# Patient Record
Sex: Female | Born: 1965 | Race: White | Hispanic: No | Marital: Married | Smoking: Never smoker
Health system: Southern US, Community
[De-identification: ages and names within clinical notes are randomized; demographics above are authoritative.]

## PROBLEM LIST (undated history)

## (undated) DIAGNOSIS — R519 Headache, unspecified: Secondary | ICD-10-CM

## (undated) DIAGNOSIS — E039 Hypothyroidism, unspecified: Secondary | ICD-10-CM

## (undated) DIAGNOSIS — Z3183 Encounter for assisted reproductive fertility procedure cycle: Secondary | ICD-10-CM

## (undated) DIAGNOSIS — O1490 Unspecified pre-eclampsia, unspecified trimester: Secondary | ICD-10-CM

## (undated) DIAGNOSIS — E785 Hyperlipidemia, unspecified: Secondary | ICD-10-CM

## (undated) DIAGNOSIS — R51 Headache: Secondary | ICD-10-CM

## (undated) DIAGNOSIS — O139 Gestational [pregnancy-induced] hypertension without significant proteinuria, unspecified trimester: Secondary | ICD-10-CM

## (undated) DIAGNOSIS — F410 Panic disorder [episodic paroxysmal anxiety] without agoraphobia: Secondary | ICD-10-CM

## (undated) DIAGNOSIS — T7840XA Allergy, unspecified, initial encounter: Secondary | ICD-10-CM

## (undated) DIAGNOSIS — C4491 Basal cell carcinoma of skin, unspecified: Secondary | ICD-10-CM

## (undated) DIAGNOSIS — N200 Calculus of kidney: Secondary | ICD-10-CM

## (undated) HISTORY — PX: OTHER SURGICAL HISTORY: SHX169

## (undated) HISTORY — PX: COLPOSCOPY: SHX161

## (undated) HISTORY — DX: Encounter for assisted reproductive fertility procedure cycle: Z31.83

## (undated) HISTORY — DX: Unspecified pre-eclampsia, unspecified trimester: O14.90

## (undated) HISTORY — DX: Gestational (pregnancy-induced) hypertension without significant proteinuria, unspecified trimester: O13.9

## (undated) HISTORY — DX: Calculus of kidney: N20.0

## (undated) HISTORY — DX: Allergy, unspecified, initial encounter: T78.40XA

## (undated) HISTORY — DX: Hypothyroidism, unspecified: E03.9

## (undated) HISTORY — DX: Basal cell carcinoma of skin, unspecified: C44.91

## (undated) HISTORY — DX: Hyperlipidemia, unspecified: E78.5

## (undated) HISTORY — DX: Panic disorder (episodic paroxysmal anxiety): F41.0

---

## 1996-05-06 HISTORY — PX: LAPAROSCOPY: SHX197

## 1996-05-06 HISTORY — PX: BREAST CYST EXCISION: SHX579

## 1997-10-26 ENCOUNTER — Other Ambulatory Visit: Admission: RE | Admit: 1997-10-26 | Discharge: 1997-10-26 | Payer: Self-pay | Admitting: Gynecology

## 1997-12-27 ENCOUNTER — Other Ambulatory Visit: Admission: RE | Admit: 1997-12-27 | Discharge: 1997-12-27 | Payer: Self-pay | Admitting: Obstetrics and Gynecology

## 1998-05-25 ENCOUNTER — Inpatient Hospital Stay (HOSPITAL_COMMUNITY): Admission: AD | Admit: 1998-05-25 | Discharge: 1998-06-02 | Payer: Self-pay | Admitting: Obstetrics & Gynecology

## 1998-05-26 ENCOUNTER — Encounter: Payer: Self-pay | Admitting: Obstetrics & Gynecology

## 1998-06-07 ENCOUNTER — Encounter (HOSPITAL_COMMUNITY): Admission: RE | Admit: 1998-06-07 | Discharge: 1998-09-05 | Payer: Self-pay | Admitting: Obstetrics and Gynecology

## 1999-01-01 ENCOUNTER — Encounter (HOSPITAL_COMMUNITY): Admission: RE | Admit: 1999-01-01 | Discharge: 1999-04-01 | Payer: Self-pay | Admitting: Obstetrics and Gynecology

## 1999-01-31 ENCOUNTER — Other Ambulatory Visit: Admission: RE | Admit: 1999-01-31 | Discharge: 1999-01-31 | Payer: Self-pay | Admitting: Obstetrics and Gynecology

## 1999-04-04 ENCOUNTER — Encounter (HOSPITAL_COMMUNITY): Admission: RE | Admit: 1999-04-04 | Discharge: 1999-07-03 | Payer: Self-pay | Admitting: Obstetrics and Gynecology

## 2000-01-30 ENCOUNTER — Other Ambulatory Visit: Admission: RE | Admit: 2000-01-30 | Discharge: 2000-01-30 | Payer: Self-pay | Admitting: Obstetrics and Gynecology

## 2001-02-03 ENCOUNTER — Other Ambulatory Visit: Admission: RE | Admit: 2001-02-03 | Discharge: 2001-02-03 | Payer: Self-pay | Admitting: Obstetrics and Gynecology

## 2002-02-05 ENCOUNTER — Other Ambulatory Visit: Admission: RE | Admit: 2002-02-05 | Discharge: 2002-02-05 | Payer: Self-pay | Admitting: Obstetrics and Gynecology

## 2002-04-09 ENCOUNTER — Encounter: Payer: Self-pay | Admitting: Emergency Medicine

## 2002-04-09 ENCOUNTER — Emergency Department (HOSPITAL_COMMUNITY): Admission: EM | Admit: 2002-04-09 | Discharge: 2002-04-09 | Payer: Self-pay | Admitting: Emergency Medicine

## 2003-02-07 ENCOUNTER — Other Ambulatory Visit: Admission: RE | Admit: 2003-02-07 | Discharge: 2003-02-07 | Payer: Self-pay | Admitting: Obstetrics and Gynecology

## 2004-02-07 ENCOUNTER — Other Ambulatory Visit: Admission: RE | Admit: 2004-02-07 | Discharge: 2004-02-07 | Payer: Self-pay | Admitting: Obstetrics and Gynecology

## 2004-11-01 ENCOUNTER — Emergency Department (HOSPITAL_COMMUNITY): Admission: EM | Admit: 2004-11-01 | Discharge: 2004-11-01 | Payer: Self-pay | Admitting: *Deleted

## 2004-11-29 ENCOUNTER — Encounter (HOSPITAL_COMMUNITY): Admission: RE | Admit: 2004-11-29 | Discharge: 2004-11-30 | Payer: Self-pay | Admitting: Emergency Medicine

## 2005-01-17 ENCOUNTER — Other Ambulatory Visit: Admission: RE | Admit: 2005-01-17 | Discharge: 2005-01-17 | Payer: Self-pay | Admitting: Obstetrics and Gynecology

## 2005-01-21 ENCOUNTER — Ambulatory Visit (HOSPITAL_COMMUNITY): Admission: RE | Admit: 2005-01-21 | Discharge: 2005-01-21 | Payer: Self-pay | Admitting: Obstetrics and Gynecology

## 2005-01-31 ENCOUNTER — Encounter: Admission: RE | Admit: 2005-01-31 | Discharge: 2005-01-31 | Payer: Self-pay | Admitting: Obstetrics and Gynecology

## 2005-03-07 ENCOUNTER — Encounter: Admission: RE | Admit: 2005-03-07 | Discharge: 2005-05-05 | Payer: Self-pay | Admitting: Obstetrics and Gynecology

## 2006-01-27 ENCOUNTER — Ambulatory Visit (HOSPITAL_COMMUNITY): Admission: RE | Admit: 2006-01-27 | Discharge: 2006-01-27 | Payer: Self-pay | Admitting: Obstetrics and Gynecology

## 2006-02-25 ENCOUNTER — Other Ambulatory Visit: Admission: RE | Admit: 2006-02-25 | Discharge: 2006-02-25 | Payer: Self-pay | Admitting: Obstetrics and Gynecology

## 2007-01-29 ENCOUNTER — Ambulatory Visit (HOSPITAL_COMMUNITY): Admission: RE | Admit: 2007-01-29 | Discharge: 2007-01-29 | Payer: Self-pay | Admitting: Obstetrics and Gynecology

## 2008-02-01 ENCOUNTER — Ambulatory Visit (HOSPITAL_COMMUNITY): Admission: RE | Admit: 2008-02-01 | Discharge: 2008-02-01 | Payer: Self-pay | Admitting: Obstetrics and Gynecology

## 2008-03-02 LAB — HM COLONOSCOPY: HM Colonoscopy: NORMAL

## 2009-02-07 ENCOUNTER — Ambulatory Visit (HOSPITAL_COMMUNITY): Admission: RE | Admit: 2009-02-07 | Discharge: 2009-02-07 | Payer: Self-pay | Admitting: Obstetrics and Gynecology

## 2010-02-08 ENCOUNTER — Ambulatory Visit (HOSPITAL_COMMUNITY): Admission: RE | Admit: 2010-02-08 | Discharge: 2010-02-08 | Payer: Self-pay | Admitting: Obstetrics and Gynecology

## 2010-03-07 ENCOUNTER — Encounter: Admission: RE | Admit: 2010-03-07 | Discharge: 2010-03-07 | Payer: Self-pay | Admitting: Obstetrics and Gynecology

## 2010-03-07 LAB — HM MAMMOGRAPHY: HM Mammogram: ABNORMAL

## 2010-04-09 ENCOUNTER — Ambulatory Visit: Payer: Self-pay | Admitting: Internal Medicine

## 2010-04-09 DIAGNOSIS — G43909 Migraine, unspecified, not intractable, without status migrainosus: Secondary | ICD-10-CM | POA: Insufficient documentation

## 2010-04-09 DIAGNOSIS — E785 Hyperlipidemia, unspecified: Secondary | ICD-10-CM

## 2010-04-09 DIAGNOSIS — Z87442 Personal history of urinary calculi: Secondary | ICD-10-CM

## 2010-04-09 DIAGNOSIS — J309 Allergic rhinitis, unspecified: Secondary | ICD-10-CM | POA: Insufficient documentation

## 2010-04-09 DIAGNOSIS — E039 Hypothyroidism, unspecified: Secondary | ICD-10-CM

## 2010-04-12 ENCOUNTER — Encounter: Payer: Self-pay | Admitting: Internal Medicine

## 2010-04-12 LAB — CONVERTED CEMR LAB
ALT: 20 units/L (ref 0–35)
AST: 38 units/L — ABNORMAL HIGH (ref 0–37)
Albumin: 4.1 g/dL (ref 3.5–5.2)
Alkaline Phosphatase: 48 units/L (ref 39–117)
BUN: 12 mg/dL (ref 6–23)
CRP, High Sensitivity: 0.9
Creatinine, Ser: 0.85 mg/dL (ref 0.40–1.20)
Glucose, Bld: 83 mg/dL (ref 70–99)
HCT: 40.5 % (ref 36.0–46.0)
LDL Cholesterol: 102 mg/dL — ABNORMAL HIGH (ref 0–99)
Platelets: 267 10*3/uL (ref 150–400)
RDW: 13 % (ref 11.5–15.5)
Thyroglobulin Ab: <20 (ref <40.0)
Thyroperoxidase Ab SerPl-aCnc: <10 (ref <35.0)
Total CHOL/HDL Ratio: 2.8

## 2010-04-16 ENCOUNTER — Encounter: Payer: Self-pay | Admitting: Internal Medicine

## 2010-04-16 ENCOUNTER — Telehealth: Payer: Self-pay | Admitting: Internal Medicine

## 2010-04-25 ENCOUNTER — Telehealth: Payer: Self-pay | Admitting: Internal Medicine

## 2010-06-07 NOTE — Progress Notes (Signed)
Summary: Lab Results  Phone Note Outgoing Call   Summary of Call: call pt - thyroid blood test normal.  I suggest pt continue same dose of thyroid medication.   repeat TSH in 6 months I will send letter re:  other lab results ok for thyroid medication refill x 6 months Initial call taken by: D. Thomos Lemons DO,  April 16, 2010 1:10 PM  Follow-up for Phone Call        call placed to patient at [phone]. Patient has been informed per Dr Artist Pais instructions. She was advised to return in 6 months to repeat TSH Follow-up by: Glendell Docker CMA,  April 16, 2010 2:01 PM    Prescriptions: ARMOUR THYROID 30 MG TABS (THYROID) Take 1 tablet by mouth once a day  #30 x 6   Entered by:   Glendell Docker CMA   Authorized by:   D. Thomos Lemons DO   Signed by:   Glendell Docker CMA on 04/16/2010   Method used:   Electronically to        [facility]. [phone]* (retail)       [address]       [place], [place]  [address]       Ph: [phone]       Fax: [phone]   RxID:   [id number]

## 2010-06-07 NOTE — Assessment & Plan Note (Signed)
Summary: TO EST   Darrick Grinder Leafy Half   Vital Signs:  Patient profile:   45 year old female Height:      63 inches Weight:      125.75 pounds BMI:     22.36 O2 Sat:      100 % on Room air Temp:     98.1 degrees F oral Pulse rate:   73 / minute Resp:     16 per minute BP sitting:   120 / 80  (right arm) Cuff size:   regular  Vitals Entered By: Glendell Docker CMA (April 09, 2010 1:38 PM)  O2 Flow:  Room air CC: New Patient  Is Patient Diabetic? No Pain Assessment Patient in pain? no      Comments establish care- monitor thyroid condition   Primary Care Provider:  Dondra Spry DO  CC:  New Patient .  History of Present Illness: 45 y/o white female to establish pt previously followed by Dr. Corine Shelter - [facility] hypothyroidism discovered 3 yrs ago   pt has been taking herbals for years  no particular reason why pt taking amour thyroid vs synthroid  mother has brain aneurysm she has had MRA in the past - reported normal she suffers from intermittent menstrual migraines  not relieved by ibuprofen  Preventive Screening-Counseling & Management  Alcohol-Tobacco     Alcohol drinks/day: 1     Smoking Status: never  Caffeine-Diet-Exercise     Caffeine use/day: 1-2 beverages daily     Does Patient Exercise: yes     Times/week: 6  Allergies (verified): 1)  ! Septra 2)  ! Minocycline Hcl  Past History:  Past Medical History: Allergic rhinitis Hypothyroidism Hx of endometriosis 1998 Nephrolithiasis, hx of Elevated BP during pregnancy - pre eclampsia (HELP - syndrome) Unexplained LLQ pain - s/p colonoscopy (Dr. 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) Hyperlipidemia  Past Surgical History: Upper Jaw surgery 1984 GYN surgery - laporoscopy 1998 Breast Cyst removed 1998  Family History: Family History of Alcoholism/Addiction - PGM Family History of Arthritis Family History Breast cancer - MGM  Family History of CAD  - MGF, MGM Family History High  cholesterol - father Family History Hypertension - father no siblings   Social History: Occupation:Homemaker Married 20 years 1 son age 36([place]) grew up in [place] Alcohol use-yes (glass of wine on weekends) Never Smoked Smoking Status:  never Caffeine use/day:  1-2 beverages daily Does Patient Exercise:  yes  Review of Systems  The patient denies weight loss, weight gain, vision loss, chest pain, syncope, dyspnea on exertion, headaches, abdominal pain, melena, hematochezia, and severe indigestion/heartburn.    Physical Exam  General:  alert, well-developed, and well-nourished.   Head:  normocephalic and atraumatic.   Eyes:  pupils equal, pupils round, and pupils reactive to light.   Ears:  R ear normal and L ear normal.   Mouth:  pharynx pink and moist.   Neck:  No deformities, masses, or tenderness noted. Lungs:  normal respiratory effort, normal breath sounds, no crackles, and no wheezes.   Heart:  normal rate, regular rhythm, no murmur, and no gallop.   Abdomen:  soft, non-tender, normal bowel sounds, no masses, no hepatomegaly, and no splenomegaly.   Extremities:  No lower extremity edema  Neurologic:  cranial nerves II-XII intact, gait normal, and DTRs symmetrical and normal.   Psych:  normally interactive, good eye contact, not anxious appearing, and not depressed appearing.     Impression & Recommendations:  Problem #  1:  HYPOTHYROIDISM (ICD-244.9) hx of hypothyroidism.  probable autoimmune monitor TFTs Her updated medication list for this problem includes:    Armour Thyroid 30 Mg Tabs (Thyroid) .Marland Kitchen... Take 1 tablet by mouth once a day  Orders: T-TSH ([id number]) T-T4, Free ([phone]) T- * Misc. Laboratory test [phone])  Problem # 2:  HYPERLIPIDEMIA (ICD-272.4)  Orders: T-Basic Metabolic Panel [phone]) T-Hepatic Function ([phone]) T-Lipid Profile [phone]) CRP, high sensitivity-[facility] ([id number])  Problem # 3:  MIGRAINE HEADACHE  (ICD-346.90)  pt gets intermittent menstrual migraines trial of triptan  Her updated medication list for this problem includes:    Frova 2.5 Mg Tabs (Frovatriptan succinate) .Marland Kitchen... 1/2 to one tab by mouth once daily as needed for migraine headache  Complete Medication List: 1)  Armour Thyroid 30 Mg Tabs (Thyroid) .... Take 1 tablet by mouth once a day 2)  Cephalexin 500 Mg Caps (Cephalexin) .... Take 1 capsule by mouth two times a day 3)  Vitamin D 1000 Unit Tabs (Cholecalciferol) .... Take 1 tablet by mouth once a day 4)  Calcium-magnesium-zinc 333-133-8.3 Mg Tabs (Calcium-magnesium-zinc) .... Take 1 tablet by mouth once a day 5)  B Complex Tabs (B complex vitamins) .... Take 1 tablet by mouth once a day 6)  Milk Thistle Xtra Caps (Milk thistle-dand-fennel-licor) .... Take 1 tablet by mouth once a day 7)  Probiotic Caps (Probiotic product) .... Take 1 capsule by mouth once a day 8)  5-htp 50 Mg Caps (5-hydroxytryptophan) .... Take 1 capsule by mouth two times a day 9)  Frova 2.5 Mg Tabs (Frovatriptan succinate) .... 1/2 to one tab by mouth once daily as needed for migraine headache  Other Orders: T-CBC No Diff ([id number]) Tdap => 54yrs IM (43329) Admin 1st Vaccine (51884)  Patient Instructions: 1)  Please schedule a follow-up appointment in 6 months.   Orders Added: 1)  T-TSH [[id number]] 2)  T-T4, Free [[id number]] 3)  T- * Misc. Laboratory test [99999] 4)  T-Basic Metabolic Panel [[id number]] 5)  T-Hepatic Function [80076-22960] 6)  T-Lipid Profile [80061-22930] 7)  CRP, high sensitivity-[facility] [phone] 8)  T-CBC No Diff [[address]] 9)  Tdap => 45yrs IM [90715] 10)  Admin 1st Vaccine [90471] 11)  New Patient Level III [99203]   Immunization History:  Influenza Immunization History:    Influenza:  declined (04/09/2010)  Immunizations Administered:  Tetanus Vaccine:    Vaccine Type: Tdap    Site: left deltoid    Mfr: GlaxoSmithKline    Dose: 0.5 ml     Route: IM    Given by: Glendell Docker CMA    Exp. Date: 02/23/2012    Lot #: [id number]    VIS given: 03/23/08 version given April 09, 2010.   Contraindications/Deferment of Procedures/Staging:    Test/Procedure: PSA    Reason for deferment: patient declined   Immunization History:  Influenza Immunization History:    Influenza:  Declined (04/09/2010)  Immunizations Administered:  Tetanus Vaccine:    Vaccine Type: Tdap    Site: left deltoid    Mfr: GlaxoSmithKline    Dose: 0.5 ml    Route: IM    Given by: Glendell Docker CMA    Exp. Date: 02/23/2012    Lot #: [id number]    VIS given: 03/23/08 version given April 09, 2010.  Current Allergies (reviewed today): ! SEPTRA ! MINOCYCLINE HCL   Preventive Care Screening  Mammogram:    Date:  03/07/2010    Results:  abnormal   Pap Smear:    Date:  04/17/2009    Results:  normal   Colonoscopy:    Date:  03/02/2008    Results:  normal

## 2010-06-07 NOTE — Letter (Signed)
   [place] at [facility] [address] [place], [place]  [address]  [place] Phone: [phone]      April 16, 2010   Teresa Bridges [address] [place], [place] [address]  RE:  LAB RESULTS  Dear  [name],  The following is an interpretation of your most recent lab tests.  Please take note of any instructions provided or changes to medications that have resulted from your lab work.  ELECTROLYTES:  Good - no changes needed  KIDNEY FUNCTION TESTS:  Good - no changes needed  LIVER FUNCTION TESTS:  Good - no changes needed  LIPID PANEL:  Good - no changes needed Triglyceride: 45   Cholesterol: 174   LDL: 102   HDL: 63   Chol/HDL%:  2.8 Ratio  THYROID STUDIES:  Thyroid studies normal TSH: 0.878     CBC:  Good - no changes needed  Thyroid antibodies - negative  C Reactive Protein - normal       Sincerely Yours,    Dr. Thomos Lemons  Appended Document:  mailed

## 2010-06-07 NOTE — Progress Notes (Signed)
Summary: Lab Results & Med change  Phone Note Call from Patient Call back at [phone]   Caller: Patient Call For: D. Thomos Lemons DO Summary of Call: patient called and left voice message wanting to know if she could switch to another thyroid medication per her converstaion with Dr Artist Pais. She states she continues to remain hungry  and she would like to know if she should be concerned about her low wbc. Initial call taken by: Glendell Docker CMA,  April 25, 2010 10:22 AM  Follow-up for Phone Call        see new rx for synthroid.  WBC minimally low.  this may be normal variant. we can repeat in 6 months  schedule repeat TSH in 3 months plz make sure pharm provides branded synthroid only Follow-up by: D. Thomos Lemons DO,  April 25, 2010 12:21 PM  Additional Follow-up for Phone Call Additional follow up Details #1::        call returned to patient [phone], no answer. A detailed voice message was left informing patient per Dr Artist Pais instructions. Message was left for patient to return call if any questions. Lab has been entered for  March of 2012 Additional Follow-up by: Glendell Docker CMA,  April 25, 2010 1:53 PM    New/Updated Medications: SYNTHROID 50 MCG TABS (LEVOTHYROXINE SODIUM) one by mouth once daily [BMN] Prescriptions: SYNTHROID 50 MCG TABS (LEVOTHYROXINE SODIUM) one by mouth once daily Brand medically necessary #30 x 3   Entered and Authorized by:   D. Thomos Lemons DO   Signed by:   D. Thomos Lemons DO on 04/25/2010   Method used:   Electronically to        [facility]. [phone]* (retail)       [address]       [place], [place]  [address]       Ph: [phone]       Fax: [phone]   RxID:   [id number]

## 2010-08-23 ENCOUNTER — Telehealth: Payer: Self-pay | Admitting: *Deleted

## 2010-08-23 DIAGNOSIS — E039 Hypothyroidism, unspecified: Secondary | ICD-10-CM

## 2010-08-23 NOTE — Telephone Encounter (Signed)
Patient called and left voice message wanting to know when she is due for blood  work, and wanted to know if she could switch back to the Armour Thyroid when refilled. Her message states she feels the Armour Thyroid works better for her than the Levothyroxine. If approved she is requesting a refill to [facility]

## 2010-08-24 ENCOUNTER — Encounter: Payer: Self-pay | Admitting: *Deleted

## 2010-08-24 MED ORDER — THYROID 30 MG PO TABS: 30.0000 mg | ORAL_TABLET | Freq: Every day | ORAL | Status: DC

## 2010-08-24 NOTE — Telephone Encounter (Signed)
Call placed to patient [phone], no answer. A detailed voice message was left informing patient TSH was normal at 0.673 per Dr Artist Pais instructions, she was advised to return in 3 months for repeat TSH. Patient was  Informed in phone message that it was okay to resume Armour Thyroid per Dr Artist Pais ok

## 2010-08-24 NOTE — Telephone Encounter (Signed)
plz call lab and check status of lab results Inform pt we are having lab reporting issues due to system conversion Pt can resume armour thyroid but we can make final decision after I review recent lab results

## 2010-08-24 NOTE — Telephone Encounter (Signed)
Patient called back and clarified voice message that she had blood work done earlier in the week, and wanted to know the status of her results. She is also requesting a rx change to Armour Thyroid.

## 2010-09-19 ENCOUNTER — Encounter: Payer: Self-pay | Admitting: Internal Medicine

## 2010-10-23 NOTE — Telephone Encounter (Signed)
This encounter was created in error - please disregard.

## 2011-01-16 ENCOUNTER — Telehealth: Payer: Self-pay | Admitting: Internal Medicine

## 2011-01-16 NOTE — Telephone Encounter (Signed)
Pt is sch to come in to see Dr Artist Pais for ov on Fri 01/18/11 re: thyroid med. Pt has not had any labs done and wants to know if she needs labs done prior to ov, or can she get labs done during ov?

## 2011-01-17 NOTE — Telephone Encounter (Signed)
Pt called back checking on status of lab order. She stated that she will be out thyroid meds on Sunday. Her pharmacy is [facility] on [address]. She would like a call back today. Thanks.

## 2011-01-17 NOTE — Telephone Encounter (Signed)
Ok to call in one month refill until OV

## 2011-01-17 NOTE — Telephone Encounter (Signed)
I suggest TSH, and CBC before OV or at OV

## 2011-01-18 ENCOUNTER — Ambulatory Visit (INDEPENDENT_AMBULATORY_CARE_PROVIDER_SITE_OTHER): Payer: 59 | Admitting: Internal Medicine

## 2011-01-18 ENCOUNTER — Encounter: Payer: Self-pay | Admitting: Internal Medicine

## 2011-01-18 DIAGNOSIS — N926 Irregular menstruation, unspecified: Secondary | ICD-10-CM

## 2011-01-18 DIAGNOSIS — E039 Hypothyroidism, unspecified: Secondary | ICD-10-CM

## 2011-01-18 LAB — TSH: TSH: 0.39 u[IU]/mL (ref 0.35–5.50)

## 2011-01-18 LAB — T4, FREE: Free T4: 0.61 ng/dL (ref 0.60–1.60)

## 2011-01-18 NOTE — Telephone Encounter (Signed)
Pt has appt today with Dr Artist Pais

## 2011-01-18 NOTE — Telephone Encounter (Signed)
LMTCB

## 2011-01-18 NOTE — Assessment & Plan Note (Signed)
Monitor TFTs.  Adjust armour thyroid dose accordingly.

## 2011-01-18 NOTE — Progress Notes (Signed)
  Subjective:    Patient ID: Teresa Bridges, female    DOB: January 25, 1966, 45 y.o.   MRN: [id number]  HPI  45 year old white female with history of hypothyroidism for routine followup. Overall patient is doing very well. No significant changes.  She is following healthy diet and has lost some weight.  This has been intentional.    Review of Systems Occasional fluttering in her chest, negative for chest pain    Past Medical History  Diagnosis Date  . Allergy   . Hypothyroidism   . Endometriosis 1998  . Nephrolithiasis   . PIH (pregnancy induced hypertension)   . Pre-eclampsia   . In vitro fertilization   . Hyperlipidemia     History   Social History  . Marital Status: Married    Spouse Name: N/A    Number of Children: N/A  . Years of Education: N/A   Occupational History  . Not on file.   Social History Main Topics  . Smoking status: Never Smoker   . Smokeless tobacco: Not on file  . Alcohol Use: Yes  . Drug Use:   . Sexually Active:    Other Topics Concern  . Not on file   Social History Narrative  . No narrative on file    Past Surgical History  Procedure Date  . Upper jaw surgery   . Laparoscopy 1998  . Breast cyst excision 1998    Family History  Problem Relation Age of Onset  . Arthritis Mother   . Heart disease Father   . Hyperlipidemia Father   . Hypertension Father   . Cancer Maternal Grandmother     breast  . Heart disease Maternal Grandfather   . Alcohol abuse Paternal Grandmother     Allergies  Allergen Reactions  . Minocycline Hcl     REACTION: Rash, Hives  . Sulfamethoxazole W/Trimethoprim     REACTION: Rash.,  Hives    Current Outpatient Prescriptions on File Prior to Visit  Medication Sig Dispense Refill  . thyroid (ARMOUR THYROID) 30 MG tablet Take 1 tablet (30 mg total) by mouth daily.  30 tablet  4    BP 134/82  Pulse 76  Temp(Src) 98.4 F (36.9 C) (Oral)  Resp 12  Wt 114 lb (51.71 kg)    Objective:   Physical  Exam   Constitutional:  thin, pleasant, no apparent distress Eyes: Conjunctivae are normal. Pupils are equal, round, and reactive to light.  Neck: Normal range of motion. Neck supple. No thyromegaly present. No carotid bruit Cardiovascular: Normal rate, regular rhythm and normal heart sounds.  Exam reveals no gallop and no friction rub.   No murmur heard. Abdominal: Soft. Bowel sounds are normal. No mass. There is no tenderness.  Neurological: Alert. No cranial nerve deficit.  Pulmonary/Chest: Effort normal and breath sounds normal.  No wheezes. No rales.  Skin: Skin is warm and dry.  Psychiatric: Normal mood and affect. Behavior is normal.        Assessment & Plan:

## 2011-01-19 LAB — CBC
Platelets: 300 10*3/uL (ref 150–400)
RBC: 4.05 MIL/uL (ref 3.87–5.11)
WBC: 6.3 10*3/uL (ref 4.0–10.5)

## 2011-01-21 ENCOUNTER — Telehealth: Payer: Self-pay | Admitting: Internal Medicine

## 2011-01-21 DIAGNOSIS — E039 Hypothyroidism, unspecified: Secondary | ICD-10-CM

## 2011-01-21 MED ORDER — THYROID 15 MG PO TABS: 22.5000 mg | ORAL_TABLET | Freq: Every day | ORAL | Status: DC

## 2011-01-21 NOTE — Telephone Encounter (Signed)
Call pt - thyroid blood testing shows pt getting slightly too much thyroid medication.   I suggest medication dose change - see new rx. Pt needs to return in 2 months for repeat TSH 244.90

## 2011-01-21 NOTE — Telephone Encounter (Signed)
L/m on pts cell phone with Dr Olegario Messier instructions.  Called in 90 day supply

## 2011-01-21 NOTE — Telephone Encounter (Signed)
Pt is requesting a 90 day rx for Armourthyroid. She stated that the pharmacy only filled a 30 day supply. Please send back to her pharmacy. Thanks.

## 2011-02-01 ENCOUNTER — Other Ambulatory Visit (HOSPITAL_COMMUNITY): Payer: Self-pay | Admitting: Obstetrics and Gynecology

## 2011-02-01 DIAGNOSIS — Z1231 Encounter for screening mammogram for malignant neoplasm of breast: Secondary | ICD-10-CM

## 2011-03-04 ENCOUNTER — Telehealth: Payer: Self-pay | Admitting: *Deleted

## 2011-03-04 NOTE — Telephone Encounter (Signed)
Pt is concerned that her recent thyroid med change has caused her to have longer menstrual cycles.  She feels more fatigued also.  Period are not a lot heavier, just longer.

## 2011-03-04 NOTE — Telephone Encounter (Signed)
If dosage change was over 1 month ago, pt can come in for TSH, free T4.  Use 244.9

## 2011-03-05 NOTE — Telephone Encounter (Signed)
appt scheduled tomorrow for a UTI.  Pt will have labs done while she is here

## 2011-03-06 ENCOUNTER — Ambulatory Visit (INDEPENDENT_AMBULATORY_CARE_PROVIDER_SITE_OTHER): Payer: 59 | Admitting: Internal Medicine

## 2011-03-06 DIAGNOSIS — N390 Urinary tract infection, site not specified: Secondary | ICD-10-CM

## 2011-03-06 DIAGNOSIS — R300 Dysuria: Secondary | ICD-10-CM

## 2011-03-06 DIAGNOSIS — E039 Hypothyroidism, unspecified: Secondary | ICD-10-CM

## 2011-03-06 LAB — POCT URINALYSIS DIPSTICK
Bilirubin, UA: NEGATIVE
Blood, UA: NEGATIVE
Glucose, UA: NEGATIVE
Nitrite, UA: NEGATIVE
Protein, UA: NEGATIVE
Urobilinogen, UA: 0.2
pH, UA: 7

## 2011-03-06 LAB — TSH: TSH: 1.55 u[IU]/mL (ref 0.35–5.50)

## 2011-03-06 MED ORDER — THYROID 15 MG PO TABS: 22.5000 mg | ORAL_TABLET | Freq: Every day | ORAL | Status: DC

## 2011-03-06 NOTE — Assessment & Plan Note (Signed)
Patient experiencing mild dysuria. Her UA was completely normal. She has followup with her GYN.

## 2011-03-06 NOTE — Assessment & Plan Note (Signed)
Continue with current dose of Armour Thyroid .  We discussed complications of overtreatment with thyroid replacement.

## 2011-03-06 NOTE — Progress Notes (Signed)
Subjective:    Patient ID: Teresa Bridges, female    DOB: Oct 15, 1965, 45 y.o.   MRN: [id number]  HPI  45 year old white female with history of hypothyroidism for followup. Her previous TSH was somewhat suppressed and her Armour Thyroid dose was reduced. Since reduction in dose patient complains of feeling fatigued. She has also noticed that her menstrual cycles have been longer than usual.  Patient also complains of mild dysuria and "feeling dry" x one month.  Review of Systems No change in weight, negative for fever chills  Past Medical History  Diagnosis Date  . Allergy   . Hypothyroidism   . Endometriosis 1998  . Nephrolithiasis   . PIH (pregnancy induced hypertension)   . Pre-eclampsia   . In vitro fertilization   . Hyperlipidemia     History   Social History  . Marital Status: Married    Spouse Name: N/A    Number of Children: N/A  . Years of Education: N/A   Occupational History  . Not on file.   Social History Main Topics  . Smoking status: Never Smoker   . Smokeless tobacco: Not on file  . Alcohol Use: Yes  . Drug Use:   . Sexually Active:    Other Topics Concern  . Not on file   Social History Narrative  . No narrative on file    Past Surgical History  Procedure Date  . Upper jaw surgery   . Laparoscopy 1998  . Breast cyst excision 1998    Family History  Problem Relation Age of Onset  . Arthritis Mother   . Heart disease Father   . Hyperlipidemia Father   . Hypertension Father   . Cancer Maternal Grandmother     breast  . Heart disease Maternal Grandfather   . Alcohol abuse Paternal Grandmother     Allergies  Allergen Reactions  . Minocycline Hcl     REACTION: Rash, Hives  . Sulfamethoxazole W/Trimethoprim     REACTION: Rash.,  Hives    Current Outpatient Prescriptions on File Prior to Visit  Medication Sig Dispense Refill  . 5-Hydroxytryptophan (5-HTP) 50 MG CAPS Take 1 capsule by mouth 2 (two) times daily.        Marland Kitchen b complex  vitamins tablet Take 1 tablet by mouth daily.        Marland Kitchen CALCIUM-MAGNESUIUM-ZINC 333-133-8.3 MG TABS Take 1 tablet by mouth daily.        . cholecalciferol (VITAMIN D) 1000 UNITS tablet Take 1,000 Units by mouth daily.        . frovatriptan (FROVA) 2.5 MG tablet Take 2.5 mg by mouth as needed. If recurs, may repeat after 2 hours. Max of 3 tabs in 24 hours.       . Milk Thistle-Dand-Fennel-Licor (MILK THISTLE XTRA) CAPS Take 1 capsule by mouth daily.        Marland Kitchen PROBIOTIC CAPS Take 1 capsule by mouth daily.         Temperature 98.4, blood pressure 112/74     Objective:   Physical Exam   Constitutional: Appears well-developed and well-nourished. No distress.  Neck: Normal range of motion. Neck supple. No thyromegaly present. No carotid bruit Cardiovascular: Normal rate, regular rhythm and normal heart sounds.  Exam reveals no gallop and no friction rub.  No murmur heard. Pulmonary/Chest: Effort normal and breath sounds normal.  No wheezes. No rales.  Abd:  Soft, non tender Skin: Skin is warm and dry.  Psychiatric: Normal mood and affect. Behavior  is normal.       Assessment & Plan:

## 2011-03-08 ENCOUNTER — Ambulatory Visit: Payer: 59 | Admitting: Internal Medicine

## 2011-03-13 ENCOUNTER — Ambulatory Visit (HOSPITAL_COMMUNITY)
Admission: RE | Admit: 2011-03-13 | Discharge: 2011-03-13 | Disposition: A | Discharge status: Home / Self Care | Payer: 59 | Source: Ambulatory Visit | Attending: Obstetrics and Gynecology | Admitting: Obstetrics and Gynecology

## 2011-03-13 DIAGNOSIS — Z1231 Encounter for screening mammogram for malignant neoplasm of breast: Secondary | ICD-10-CM | POA: Insufficient documentation

## 2011-08-16 ENCOUNTER — Other Ambulatory Visit: Payer: Self-pay | Admitting: Internal Medicine

## 2011-10-07 ENCOUNTER — Ambulatory Visit (INDEPENDENT_AMBULATORY_CARE_PROVIDER_SITE_OTHER): Payer: 59 | Admitting: Internal Medicine

## 2011-10-07 ENCOUNTER — Telehealth: Payer: Self-pay

## 2011-10-07 VITALS — BP 122/80 | Temp 98.2°F | Wt 116.0 lb

## 2011-10-07 DIAGNOSIS — N390 Urinary tract infection, site not specified: Secondary | ICD-10-CM | POA: Insufficient documentation

## 2011-10-07 DIAGNOSIS — R350 Frequency of micturition: Secondary | ICD-10-CM

## 2011-10-07 DIAGNOSIS — E039 Hypothyroidism, unspecified: Secondary | ICD-10-CM

## 2011-10-07 LAB — POCT URINALYSIS DIPSTICK
Bilirubin, UA: NEGATIVE
Glucose, UA: NEGATIVE
Nitrite, UA: NEGATIVE
Urobilinogen, UA: 0.2

## 2011-10-07 MED ORDER — CEFUROXIME AXETIL 250 MG PO TABS: 250.0000 mg | ORAL_TABLET | Freq: Two times a day (BID) | ORAL | Status: AC

## 2011-10-07 NOTE — Assessment & Plan Note (Addendum)
46 year old white female with signs and symptoms of uncomplicated UTI. Treat with cefuroxime 250 mg twice daily x3 days. Increase fluid intake. Obtain urine culture.  Patient advised to call office if symptoms persist or worsen.

## 2011-10-07 NOTE — Progress Notes (Signed)
  Subjective:    Patient ID: Teresa Bridges, female    DOB: July 22, 1965, 46 y.o.   MRN: [id number]  Urinary Frequency  This is a new problem. The current episode started 1 to 4 weeks ago. The problem occurs intermittently. The problem has been unchanged. The patient is experiencing no pain. There has been no fever. She is sexually active. There is no history of pyelonephritis. Associated symptoms include frequency. Pertinent negatives include no flank pain or hematuria. She has tried increased fluids for the symptoms. The treatment provided no relief.      Review of Systems  Genitourinary: Positive for frequency. Negative for hematuria and flank pain.   Past Medical History  Diagnosis Date  . Allergy   . Hypothyroidism   . Endometriosis 1998  . Nephrolithiasis   . PIH (pregnancy induced hypertension)   . Pre-eclampsia   . In vitro fertilization   . Hyperlipidemia     History   Social History  . Marital Status: Married    Spouse Name: N/A    Number of Children: N/A  . Years of Education: N/A   Occupational History  . Not on file.   Social History Main Topics  . Smoking status: Never Smoker   . Smokeless tobacco: Not on file  . Alcohol Use: Yes  . Drug Use:   . Sexually Active:    Other Topics Concern  . Not on file   Social History Narrative  . No narrative on file    Past Surgical History  Procedure Date  . Upper jaw surgery   . Laparoscopy 1998  . Breast cyst excision 1998    Family History  Problem Relation Age of Onset  . Arthritis Mother   . Heart disease Father   . Hyperlipidemia Father   . Hypertension Father   . Cancer Maternal Grandmother     breast  . Heart disease Maternal Grandfather   . Alcohol abuse Paternal Grandmother     Allergies  Allergen Reactions  . Minocycline Hcl     REACTION: Rash, Hives  . Sulfamethoxazole W-Trimethoprim     REACTION: Rash.,  Hives    Current Outpatient Prescriptions on File Prior to Visit  Medication  Sig Dispense Refill  . thyroid (ARMOUR) 15 MG tablet Take 1.5 tablets (22.5 mg total) by mouth daily.  120 tablet  1  . DISCONTD: ARMOUR THYROID 30 MG tablet take 1 tablet by mouth once daily  30 tablet  4    BP 122/80  Temp(Src) 98.2 F (36.8 C) (Oral)  Wt 116 lb (52.617 kg)        Objective:   Physical Exam  Constitutional: She is oriented to person, place, and time. She appears well-developed and well-nourished.  Cardiovascular: Normal rate, regular rhythm and normal heart sounds.   Pulmonary/Chest: Effort normal and breath sounds normal. She has no wheezes. She has no rales.  Abdominal: Soft. Bowel sounds are normal. She exhibits no distension and no mass.       No flank tenderness  Neurological: She is alert and oriented to person, place, and time.          Assessment & Plan:

## 2011-10-07 NOTE — Telephone Encounter (Signed)
Pt thinks she has a UTI, did an at home test that tested positive.  Scheduled with Dr. Artist Pais for 2:00 today.

## 2011-10-07 NOTE — Patient Instructions (Signed)
Please call our office if you urinary symptoms do not improve. We will contact you re: thyroid blood test results

## 2011-10-07 NOTE — Assessment & Plan Note (Signed)
Monitor TFTs

## 2011-10-08 ENCOUNTER — Other Ambulatory Visit: Payer: Self-pay | Admitting: *Deleted

## 2011-10-08 DIAGNOSIS — E039 Hypothyroidism, unspecified: Secondary | ICD-10-CM

## 2011-10-08 MED ORDER — THYROID 15 MG PO TABS: 22.5000 mg | ORAL_TABLET | Freq: Every day | ORAL | Status: DC

## 2011-10-10 LAB — URINE CULTURE: Colony Count: >=100000

## 2012-02-13 ENCOUNTER — Other Ambulatory Visit: Payer: Self-pay | Admitting: Obstetrics and Gynecology

## 2012-02-13 DIAGNOSIS — Z1231 Encounter for screening mammogram for malignant neoplasm of breast: Secondary | ICD-10-CM

## 2012-03-16 ENCOUNTER — Ambulatory Visit
Admission: RE | Admit: 2012-03-16 | Discharge: 2012-03-16 | Disposition: A | Discharge status: Home / Self Care | Payer: 59 | Source: Ambulatory Visit | Attending: Obstetrics and Gynecology | Admitting: Obstetrics and Gynecology

## 2012-03-16 DIAGNOSIS — Z1231 Encounter for screening mammogram for malignant neoplasm of breast: Secondary | ICD-10-CM

## 2012-03-22 ENCOUNTER — Other Ambulatory Visit: Payer: Self-pay | Admitting: Internal Medicine

## 2012-03-23 ENCOUNTER — Telehealth: Payer: Self-pay | Admitting: Obstetrics and Gynecology

## 2012-04-15 ENCOUNTER — Ambulatory Visit (INDEPENDENT_AMBULATORY_CARE_PROVIDER_SITE_OTHER): Payer: Commercial Managed Care - PPO | Admitting: Obstetrics and Gynecology

## 2012-04-15 ENCOUNTER — Encounter: Payer: Self-pay | Admitting: Obstetrics and Gynecology

## 2012-04-15 VITALS — BP 110/74 | Resp 16 | Ht 63.0 in | Wt 115.0 lb

## 2012-04-15 DIAGNOSIS — N952 Postmenopausal atrophic vaginitis: Secondary | ICD-10-CM

## 2012-04-15 DIAGNOSIS — L678 Other hair color and hair shaft abnormalities: Secondary | ICD-10-CM

## 2012-04-15 DIAGNOSIS — L738 Other specified follicular disorders: Secondary | ICD-10-CM

## 2012-04-15 DIAGNOSIS — Z124 Encounter for screening for malignant neoplasm of cervix: Secondary | ICD-10-CM

## 2012-04-15 DIAGNOSIS — Z01419 Encounter for gynecological examination (general) (routine) without abnormal findings: Secondary | ICD-10-CM

## 2012-04-15 DIAGNOSIS — L739 Follicular disorder, unspecified: Secondary | ICD-10-CM

## 2012-04-15 MED ORDER — CIPROFLOXACIN HCL 500 MG PO TABS: 500.0000 mg | ORAL_TABLET | Freq: Two times a day (BID) | ORAL | Status: AC

## 2012-04-15 MED ORDER — FLUCONAZOLE 150 MG PO TABS: 150.0000 mg | ORAL_TABLET | Freq: Every day | ORAL | Status: DC

## 2012-04-15 MED ORDER — ESTRADIOL 10 MCG VA TABS: 10.0000 ug | ORAL_TABLET | VAGINAL | Status: DC

## 2012-04-15 NOTE — Addendum Note (Signed)
Addended by: Janine Limbo on: 04/15/2012 06:34 PM   Modules accepted: Orders

## 2012-04-15 NOTE — Progress Notes (Signed)
Subjective:    Teresa Bridges is a 46 y.o. female, No obstetric history on file., who presents for an annual exam. She is doing well with Vagifem.  She is beginning to have menopausal symptoms.  She complains of a "hair bump" near her vulva.  Prior Hysterectomy: No    History   Social History  . Marital Status: Married    Spouse Name: N/A    Number of Children: N/A  . Years of Education: N/A   Social History Main Topics  . Smoking status: Never Smoker   . Smokeless tobacco: Never Used  . Alcohol Use: Yes     Comment: socially  . Drug Use: No  . Sexually Active: Yes -- Female partner(s)    Birth Control/ Protection: None   Other Topics Concern  . Not on file   Social History Narrative  . No narrative on file    Menstrual cycle:   LMP: Patient's last menstrual period was 04/04/2012.           Cycle: irregular  The following portions of the patient's history were reviewed and updated as appropriate: allergies, current medications, past family history, past medical history, past social history, past surgical history and problem list.  Review of Systems Pertinent items are noted in HPI. Breast:Negative for breast lump,nipple discharge or nipple retraction Gastrointestinal: Negative for abdominal pain, change in bowel habits or rectal bleeding Urinary:negative   Objective:    BP 110/74  Resp 16  Ht 5\' 3"  (1.6 m)  Wt 115 lb (52.164 kg)  BMI 20.37 kg/m2  LMP 04/04/2012    Weight:  Wt Readings from Last 1 Encounters:  04/15/12 115 lb (52.164 kg)          BMI: Body mass index is 20.37 kg/(m^2).  General Appearance: Alert, appropriate appearance for age. No acute distress HEENT: Grossly normal Neck / Thyroid: Supple, no masses, nodes or enlargement Lungs: clear to auscultation bilaterally Back: No CVA tenderness Breast Exam: No masses or nodes.No dimpling, nipple retraction or discharge. Cardiovascular: Regular rate and rhythm. S1, S2, no murmur Gastrointestinal:  Soft, non-tender, no masses or organomegaly  ++++++++++++++++++++++++++++++++++++++++++++++++++++++++  Pelvic Exam: External genitalia: normal general appearance and inflamed hair bump on the left Vaginal: normal without tenderness, induration or masses and relaxation noted Cervix: normal appearance Adnexa: normal bimanual exam Uterus: nontender, normal size Rectovaginal: normal rectal, no masses  ++++++++++++++++++++++++++++++++++++++++++++++++++++++++  Lymphatic Exam: Non-palpable nodes in neck, clavicular, axillary, or inguinal regions Neurologic: Normal speech, no tremor  Psychiatric: Alert and oriented, appropriate affect.   Assessment:    Normal gyn exam perimenopausal symptoms   Inflamed hair bump  Overweight or obese: No   Pelvic relaxation: Yes   Plan:    pap smear return annually or prn Contraception:no method  Ciprofloxacin 500 mg twice each day for 7 days Diflucan 150 mg x1 Vagifem 10 micrograms    STD screen request: No   The updated Pap smear screening guidelines were discussed with the patient. The patient requested that I obtain a Pap smear: Yes.  Kegel exercises discussed: Yes.  Proper diet and regular exercise were reviewed.  Annual mammograms recommended starting at age 70. Proper breast care was discussed.  Screening colonoscopy is recommended beginning at age 66.  Regular health maintenance was reviewed.  Sleep hygiene was discussed.  Adequate calcium and vitamin D intake was emphasized.  Leonard Schwartz M.D.    Regular Periods: no Mammogram: yes  Monthly Breast Ex.: no Exercise: yes  Tetanus < 10  years: yes Seatbelts: yes  NI. Bladder Functn.: yes Abuse at home: no  Daily BM's: yes Stressful Work: no  Healthy Diet: yes Sigmoid-Colonoscopy: 2008  Calcium: no Medical problems this year: Hair bump at bikini line that is painful.    LAST PAP:04-11-2009 WNL  Contraception: NONE  Mammogram:  03/2012 WNL   PCP:  Dr.YOO  PMH: NO changes  FMH: NO Changes  Last Bone Scan: Never  Irreg Periods: yes Mood Swings: yes Hot Flashes: no Vaginal Dryness: yes Poor Sleeping: no Urinary Urgency: yes UTI Symptoms: no HRT: no Fam Hx Osteo: no Prior Bone Scan: No Osteoporosis: No Hx of Dvt: No

## 2012-04-16 LAB — PAP IG W/ RFLX HPV ASCU

## 2012-07-22 ENCOUNTER — Ambulatory Visit (INDEPENDENT_AMBULATORY_CARE_PROVIDER_SITE_OTHER): Payer: 59 | Admitting: Internal Medicine

## 2012-07-22 ENCOUNTER — Encounter: Payer: Self-pay | Admitting: Internal Medicine

## 2012-07-22 VITALS — BP 122/68 | HR 68 | Temp 97.9°F | Wt 116.0 lb

## 2012-07-22 DIAGNOSIS — E039 Hypothyroidism, unspecified: Secondary | ICD-10-CM

## 2012-07-22 LAB — CBC WITH DIFFERENTIAL/PLATELET
Basophils Relative: 1 % (ref 0.0–3.0)
Eosinophils Absolute: 0 10*3/uL (ref 0.0–0.7)
Eosinophils Relative: 0.6 % (ref 0.0–5.0)
Lymphocytes Relative: 31.5 % (ref 12.0–46.0)
MCHC: 33.8 g/dL (ref 30.0–36.0)
Neutrophils Relative %: 58.7 % (ref 43.0–77.0)
Platelets: 253 10*3/uL (ref 150.0–400.0)
RBC: 4.07 Mil/uL (ref 3.87–5.11)
WBC: 5.3 10*3/uL (ref 4.5–10.5)

## 2012-07-22 LAB — BASIC METABOLIC PANEL
BUN: 15 mg/dL (ref 6–23)
Calcium: 9 mg/dL (ref 8.4–10.5)
Creatinine, Ser: 0.8 mg/dL (ref 0.4–1.2)

## 2012-07-22 LAB — TSH: TSH: 0.4 u[IU]/mL (ref 0.35–5.50)

## 2012-07-22 NOTE — Progress Notes (Signed)
  Subjective:    Patient ID: Teresa Bridges, female    DOB: 1966-02-16, 47 y.o.   MRN: [id number]  HPI  47 year old white female with history of hypothyroidism for routine followup. She denies any significant interval medical history. She was seen by her GYN for routine Pap and pelvic. Her exam was unremarkable.  Hypothyroidism- she has been taking her usual dose of Armour Thyroid. She denies any weight change. She denies any chronic fatigue.   Review of Systems Rare palpitations.  Negative for chest pain     Past Medical History  Diagnosis Date  . Allergy   . Hypothyroidism   . Endometriosis 1998  . Nephrolithiasis   . PIH (pregnancy induced hypertension)   . Pre-eclampsia   . In vitro fertilization   . Hyperlipidemia     History   Social History  . Marital Status: Married    Spouse Name: N/A    Number of Children: N/A  . Years of Education: N/A   Occupational History  . Not on file.   Social History Main Topics  . Smoking status: Never Smoker   . Smokeless tobacco: Never Used  . Alcohol Use: Yes     Comment: socially  . Drug Use: No  . Sexually Active: Yes -- Female partner(s)    Birth Control/ Protection: None   Other Topics Concern  . Not on file   Social History Narrative  . No narrative on file    Past Surgical History  Procedure Laterality Date  . Upper jaw surgery    . Laparoscopy  1998  . Breast cyst excision  1998    Family History  Problem Relation Age of Onset  . Arthritis Mother   . Heart disease Father   . Hyperlipidemia Father   . Hypertension Father   . Cancer Maternal Grandmother     breast  . Heart disease Maternal Grandfather   . Alcohol abuse Paternal Grandmother     Allergies  Allergen Reactions  . Minocycline Hcl     REACTION: Rash, Hives  . Sulfamethoxazole W-Trimethoprim     REACTION: Rash.,  Hives    Current Outpatient Prescriptions on File Prior to Visit  Medication Sig Dispense Refill  . ARMOUR THYROID 15 MG  tablet TAKE 1 AND A 1/2 TABLETS BY MOUTH ONCE DAILY  45 each  3  . Estradiol (VAGIFEM) 10 MCG TABS Place 1 tablet (10 mcg total) vaginally 2 (two) times a week.  24 tablet  3   No current facility-administered medications on file prior to visit.    BP 122/68  Pulse 68  Temp(Src) 97.9 F (36.6 C) (Oral)  Wt 116 lb (52.617 kg)  BMI 20.55 kg/m2    Objective:   Physical Exam  Constitutional: She appears well-developed and well-nourished.  Neck: Neck supple. No thyromegaly present.  Cardiovascular: Normal rate, regular rhythm and normal heart sounds.   Pulmonary/Chest: Effort normal and breath sounds normal. She has no wheezes.  Neurological: No cranial nerve deficit.  Skin: Skin is warm and dry.  Psychiatric: She has a normal mood and affect. Her behavior is normal.          Assessment & Plan:

## 2012-07-22 NOTE — Patient Instructions (Addendum)
Please complete the following lab tests in 6 months: TSH - 244.9 FLP - V70

## 2012-07-22 NOTE — Assessment & Plan Note (Signed)
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 Monitor thyroid function tests. Adjust her thyroid replacement dose accordingly.

## 2012-07-23 MED ORDER — THYROID 15 MG PO TABS: 22.5000 mg | ORAL_TABLET | Freq: Every day | ORAL | Status: DC

## 2012-07-23 NOTE — Addendum Note (Signed)
Addended by: Meda Coffee on: 07/23/2012 01:13 PM   Modules accepted: Orders

## 2013-01-22 ENCOUNTER — Other Ambulatory Visit: Payer: Commercial Managed Care - PPO

## 2013-02-11 ENCOUNTER — Other Ambulatory Visit: Payer: Self-pay

## 2013-02-11 DIAGNOSIS — Z1231 Encounter for screening mammogram for malignant neoplasm of breast: Secondary | ICD-10-CM

## 2013-03-04 ENCOUNTER — Telehealth: Payer: Self-pay | Admitting: Internal Medicine

## 2013-03-04 NOTE — Telephone Encounter (Signed)
Pt is sch for cpx labs on 03/17/13 and would like to have t3 and t4 check as well as hormone levels. Can I add to labs?

## 2013-03-04 NOTE — Telephone Encounter (Signed)
Ok to add labs?

## 2013-03-05 NOTE — Telephone Encounter (Signed)
Labs added.

## 2013-03-17 ENCOUNTER — Other Ambulatory Visit (INDEPENDENT_AMBULATORY_CARE_PROVIDER_SITE_OTHER): Payer: Commercial Managed Care - PPO

## 2013-03-17 ENCOUNTER — Ambulatory Visit
Admission: RE | Admit: 2013-03-17 | Discharge: 2013-03-17 | Disposition: A | Discharge status: Home / Self Care | Payer: Commercial Managed Care - PPO | Source: Ambulatory Visit

## 2013-03-17 DIAGNOSIS — Z0000 Encounter for general adult medical examination without abnormal findings: Secondary | ICD-10-CM

## 2013-03-17 DIAGNOSIS — Z1231 Encounter for screening mammogram for malignant neoplasm of breast: Secondary | ICD-10-CM

## 2013-03-17 LAB — BASIC METABOLIC PANEL
BUN: 17 mg/dL (ref 6–23)
Chloride: 102 mEq/L (ref 96–112)
GFR: 78.21 mL/min (ref >60.00)
Potassium: 4.2 mEq/L (ref 3.5–5.1)
Sodium: 135 mEq/L (ref 135–145)

## 2013-03-17 LAB — CBC WITH DIFFERENTIAL/PLATELET
Basophils Relative: 1.5 % (ref 0.0–3.0)
Eosinophils Relative: 1.8 % (ref 0.0–5.0)
HCT: 37.8 % (ref 36.0–46.0)
Hemoglobin: 12.8 g/dL (ref 12.0–15.0)
Lymphs Abs: 1.2 10*3/uL (ref 0.7–4.0)
MCV: 92.9 fl (ref 78.0–100.0)
Monocytes Absolute: 0.4 10*3/uL (ref 0.1–1.0)
Monocytes Relative: 10.6 % (ref 3.0–12.0)
Neutro Abs: 2.1 10*3/uL (ref 1.4–7.7)
Platelets: 220 10*3/uL (ref 150.0–400.0)
WBC: 3.9 10*3/uL — ABNORMAL LOW (ref 4.5–10.5)

## 2013-03-17 LAB — T4, FREE: Free T4: 0.52 ng/dL — ABNORMAL LOW (ref 0.60–1.60)

## 2013-03-17 LAB — POCT URINALYSIS DIPSTICK
Glucose, UA: NEGATIVE
Leukocytes, UA: NEGATIVE
Nitrite, UA: NEGATIVE
Protein, UA: NEGATIVE
Spec Grav, UA: 1.015
Urobilinogen, UA: 0.2

## 2013-03-17 LAB — HEPATIC FUNCTION PANEL
AST: 42 U/L — ABNORMAL HIGH (ref 0–37)
Albumin: 3.8 g/dL (ref 3.5–5.2)
Total Bilirubin: 0.9 mg/dL (ref 0.3–1.2)

## 2013-03-17 LAB — T3, FREE: T3, Free: 2.9 pg/mL (ref 2.3–4.2)

## 2013-03-17 LAB — TSH: TSH: 1.11 u[IU]/mL (ref 0.35–5.50)

## 2013-03-17 LAB — LIPID PANEL
Cholesterol: 180 mg/dL (ref 0–200)
LDL Cholesterol: 105 mg/dL — ABNORMAL HIGH (ref 0–99)
Total CHOL/HDL Ratio: 3
VLDL: 9.2 mg/dL (ref 0.0–40.0)

## 2013-03-24 ENCOUNTER — Encounter: Payer: Self-pay | Admitting: Internal Medicine

## 2013-03-24 ENCOUNTER — Ambulatory Visit (INDEPENDENT_AMBULATORY_CARE_PROVIDER_SITE_OTHER): Payer: Commercial Managed Care - PPO | Admitting: Internal Medicine

## 2013-03-24 VITALS — BP 124/78 | HR 72 | Temp 98.6°F | Ht 63.0 in | Wt 117.0 lb

## 2013-03-24 DIAGNOSIS — E039 Hypothyroidism, unspecified: Secondary | ICD-10-CM

## 2013-03-24 DIAGNOSIS — Z0000 Encounter for general adult medical examination without abnormal findings: Secondary | ICD-10-CM

## 2013-03-24 DIAGNOSIS — N952 Postmenopausal atrophic vaginitis: Secondary | ICD-10-CM

## 2013-03-24 MED ORDER — ESTRADIOL 10 MCG VA TABS: 10.0000 ug | ORAL_TABLET | VAGINAL | Status: DC

## 2013-03-24 MED ORDER — THYROID 15 MG PO TABS: 22.5000 mg | ORAL_TABLET | Freq: Every day | ORAL | Status: DC

## 2013-03-24 NOTE — Progress Notes (Signed)
Subjective:    Patient ID: Luciano Cutter, female    DOB: [birth year], 47 y.o.   MRN: [id number]  HPI  47 year old white female with hx of hypothyroidism, pre eclamsia and hyperlipidemia for routine cpx.  She denies significant interval medical hx.    She has intermittent irritability.  She thinks she is perimenopausal.  She exercises regularly.  She follows health diet.  She sees dermatologist regularly for screening   Review of Systems  Constitutional: Negative for activity change, appetite change and unexpected weight change.  Eyes: Negative for visual disturbance.  Respiratory: Negative for cough, chest tightness and shortness of breath.   Cardiovascular: Negative for chest pain.  Genitourinary: Negative for difficulty urinating.  Neurological: Negative for headaches.  Gastrointestinal: Negative for abdominal pain, heartburn melena or hematochezia Psych: Negative for depression or anxiety Endo:  No polyuria or polydypsia        Past Medical History  Diagnosis Date  . Allergy   . Hypothyroidism   . Endometriosis 1998  . Nephrolithiasis   . PIH (pregnancy induced hypertension)   . Pre-eclampsia   . In vitro fertilization   . Hyperlipidemia     History   Social History  . Marital Status: Married    Spouse Name: N/A    Number of Children: N/A  . Years of Education: N/A   Occupational History  . Not on file.   Social History Main Topics  . Smoking status: Never Smoker   . Smokeless tobacco: Never Used  . Alcohol Use: Yes     Comment: socially  . Drug Use: No  . Sexual Activity: Yes    Partners: Male    Birth Control/ Protection: None   Other Topics Concern  . Not on file   Social History Narrative  . No narrative on file    Past Surgical History  Procedure Laterality Date  . Upper jaw surgery    . Laparoscopy  1998  . Breast cyst excision  1998    Family History  Problem Relation Age of Onset  . Arthritis Mother   . Heart disease Father   .  Hyperlipidemia Father   . Hypertension Father   . Cancer Maternal Grandmother     breast  . Heart disease Maternal Grandfather   . Alcohol abuse Paternal Grandmother     Allergies  Allergen Reactions  . Minocycline Hcl     REACTION: Rash, Hives  . Sulfamethoxazole-Trimethoprim     REACTION: Rash.,  Hives    No current outpatient prescriptions on file prior to visit.   No current facility-administered medications on file prior to visit.    BP 124/78  Pulse 72  Temp(Src) 98.6 F (37 C) (Oral)  Ht 5\' 3"  (1.6 m)  Wt 117 lb (53.071 kg)  BMI 20.73 kg/m2    Objective:   Physical Exam  Constitutional: She is oriented to person, place, and time.  Thin, pleasant, 47 year old female  HENT:  Head: Normocephalic and atraumatic.  Right Ear: External ear normal.  Left Ear: External ear normal.  Mouth/Throat: Oropharynx is clear and moist.  Eyes: Conjunctivae and EOM are normal. Pupils are equal, round, and reactive to light.  Neck: Neck supple. No thyromegaly present.  No carotid bruit  Cardiovascular: Normal rate, regular rhythm, normal heart sounds and intact distal pulses.   No murmur heard. Pulmonary/Chest: Effort normal and breath sounds normal. She has no wheezes.  Abdominal: Soft. Bowel sounds are normal. There is no tenderness.  Musculoskeletal: She exhibits no edema.  Lymphadenopathy:    She has no cervical adenopathy.  Neurological: She is alert and oriented to person, place, and time.  Skin: Skin is warm and dry.  Psychiatric: She has a normal mood and affect. Her behavior is normal.          Assessment & Plan:

## 2013-03-24 NOTE — Assessment & Plan Note (Addendum)
Reviewed adult health maintenance protocols. Start colon cancer screening at age 47.  She is up to date with mammogram.  She has family hx of hypertension.  She occasionally has high normal readings.  I discussed importance of regular aerobic exercise.  Patient declines flu vaccine.

## 2013-03-24 NOTE — Assessment & Plan Note (Signed)
TFTs reviewed.  Continue same dose of thyroid replacement.  Patient possibly experiencing perimenopausal symptoms.  We discussed considering trial of low dose SSRI.

## 2013-03-24 NOTE — Patient Instructions (Signed)
Please complete the following lab tests before your next follow up appointment: CPX , TSH, Free T3, Free T4 - 244.9

## 2014-01-14 ENCOUNTER — Other Ambulatory Visit: Payer: Self-pay | Admitting: *Deleted

## 2014-01-14 MED ORDER — THYROID 15 MG PO TABS: 22.5000 mg | ORAL_TABLET | Freq: Every day | ORAL | Status: DC

## 2014-03-08 ENCOUNTER — Other Ambulatory Visit: Payer: Self-pay

## 2014-03-08 DIAGNOSIS — Z1231 Encounter for screening mammogram for malignant neoplasm of breast: Secondary | ICD-10-CM

## 2014-03-24 ENCOUNTER — Telehealth: Payer: Self-pay | Admitting: Internal Medicine

## 2014-03-24 DIAGNOSIS — E039 Hypothyroidism, unspecified: Secondary | ICD-10-CM

## 2014-03-24 NOTE — Telephone Encounter (Signed)
Pt would like to come in for tsh level etc and to be check for ? Hashimoto condition. Can I sch?

## 2014-03-25 NOTE — Telephone Encounter (Signed)
yes

## 2014-03-25 NOTE — Telephone Encounter (Signed)
Cindy please put order in system

## 2014-03-28 ENCOUNTER — Ambulatory Visit
Admission: RE | Admit: 2014-03-28 | Discharge: 2014-03-28 | Disposition: A | Discharge status: Home / Self Care | Payer: Commercial Managed Care - PPO | Source: Ambulatory Visit

## 2014-03-28 DIAGNOSIS — Z1231 Encounter for screening mammogram for malignant neoplasm of breast: Secondary | ICD-10-CM

## 2014-03-28 NOTE — Telephone Encounter (Signed)
Future orders placed 

## 2014-03-28 NOTE — Telephone Encounter (Signed)
Patient called back and wanted CPX and CPX labs as well.  Patient is now scheduled.

## 2014-03-28 NOTE — Telephone Encounter (Signed)
lmom for pt to cb

## 2014-03-29 ENCOUNTER — Other Ambulatory Visit (INDEPENDENT_AMBULATORY_CARE_PROVIDER_SITE_OTHER): Payer: Commercial Managed Care - PPO

## 2014-03-29 DIAGNOSIS — E039 Hypothyroidism, unspecified: Secondary | ICD-10-CM

## 2014-03-29 DIAGNOSIS — Z0000 Encounter for general adult medical examination without abnormal findings: Secondary | ICD-10-CM

## 2014-03-29 LAB — HEPATIC FUNCTION PANEL
ALT: 28 U/L (ref 0–35)
AST: 48 U/L — AB (ref 0–37)
Albumin: 4.3 g/dL (ref 3.5–5.2)
Alkaline Phosphatase: 42 U/L (ref 39–117)
Bilirubin, Direct: 0 mg/dL (ref 0.0–0.3)
Total Bilirubin: 0.7 mg/dL (ref 0.2–1.2)
Total Protein: 6.6 g/dL (ref 6.0–8.3)

## 2014-03-29 LAB — POCT URINALYSIS DIPSTICK
Bilirubin, UA: NEGATIVE
Blood, UA: NEGATIVE
Glucose, UA: NEGATIVE
KETONES UA: NEGATIVE
LEUKOCYTES UA: NEGATIVE
Nitrite, UA: NEGATIVE
PH UA: 8.5
Protein, UA: NEGATIVE
Spec Grav, UA: 1.015
Urobilinogen, UA: 0.2

## 2014-03-29 LAB — LIPID PANEL
CHOLESTEROL: 224 mg/dL — AB (ref 0–200)
HDL: 70.8 mg/dL (ref >39.00)
LDL CALC: 143 mg/dL — AB (ref 0–99)
NonHDL: 153.2
TRIGLYCERIDES: 52 mg/dL (ref 0.0–149.0)
Total CHOL/HDL Ratio: 3
VLDL: 10.4 mg/dL (ref 0.0–40.0)

## 2014-03-29 LAB — CBC WITH DIFFERENTIAL/PLATELET
BASOS PCT: 0.8 % (ref 0.0–3.0)
Basophils Absolute: 0 10*3/uL (ref 0.0–0.1)
EOS PCT: 4.1 % (ref 0.0–5.0)
Eosinophils Absolute: 0.1 10*3/uL (ref 0.0–0.7)
HCT: 40.8 % (ref 36.0–46.0)
Hemoglobin: 13.6 g/dL (ref 12.0–15.0)
LYMPHS PCT: 43.1 % (ref 12.0–46.0)
Lymphs Abs: 1.5 10*3/uL (ref 0.7–4.0)
MCHC: 33.4 g/dL (ref 30.0–36.0)
MCV: 92.6 fl (ref 78.0–100.0)
Monocytes Absolute: 0.3 10*3/uL (ref 0.1–1.0)
Monocytes Relative: 8.4 % (ref 3.0–12.0)
Neutro Abs: 1.5 10*3/uL (ref 1.4–7.7)
Neutrophils Relative %: 43.6 % (ref 43.0–77.0)
PLATELETS: 261 10*3/uL (ref 150.0–400.0)
RBC: 4.41 Mil/uL (ref 3.87–5.11)
RDW: 12.4 % (ref 11.5–15.5)
WBC: 3.5 10*3/uL — AB (ref 4.0–10.5)

## 2014-03-29 LAB — BASIC METABOLIC PANEL
BUN: 16 mg/dL (ref 6–23)
CHLORIDE: 102 meq/L (ref 96–112)
CO2: 29 mEq/L (ref 19–32)
Calcium: 9.4 mg/dL (ref 8.4–10.5)
Creatinine, Ser: 0.8 mg/dL (ref 0.4–1.2)
GFR: 81.24 mL/min (ref >60.00)
Glucose, Bld: 82 mg/dL (ref 70–99)
Potassium: 4.4 mEq/L (ref 3.5–5.1)
SODIUM: 142 meq/L (ref 135–145)

## 2014-03-29 LAB — T4, FREE: Free T4: 0.68 ng/dL (ref 0.60–1.60)

## 2014-03-29 LAB — TSH: TSH: 1.78 u[IU]/mL (ref 0.35–4.50)

## 2014-03-29 LAB — T3, FREE: T3, Free: 3.3 pg/mL (ref 2.3–4.2)

## 2014-04-13 ENCOUNTER — Encounter: Payer: Self-pay | Admitting: Internal Medicine

## 2014-04-13 ENCOUNTER — Ambulatory Visit (INDEPENDENT_AMBULATORY_CARE_PROVIDER_SITE_OTHER): Payer: Commercial Managed Care - PPO | Admitting: Internal Medicine

## 2014-04-13 VITALS — BP 162/100 | HR 76 | Temp 98.9°F | Ht 62.75 in | Wt 114.0 lb

## 2014-04-13 DIAGNOSIS — Z0000 Encounter for general adult medical examination without abnormal findings: Secondary | ICD-10-CM

## 2014-04-13 DIAGNOSIS — R232 Flushing: Secondary | ICD-10-CM | POA: Insufficient documentation

## 2014-04-13 DIAGNOSIS — N951 Menopausal and female climacteric states: Secondary | ICD-10-CM

## 2014-04-13 DIAGNOSIS — R7989 Other specified abnormal findings of blood chemistry: Secondary | ICD-10-CM

## 2014-04-13 DIAGNOSIS — E039 Hypothyroidism, unspecified: Secondary | ICD-10-CM

## 2014-04-13 DIAGNOSIS — R945 Abnormal results of liver function studies: Secondary | ICD-10-CM

## 2014-04-13 LAB — IRON AND TIBC
%SAT: 30 % (ref 20–55)
Iron: 108 ug/dL (ref 42–145)
TIBC: 356 ug/dL (ref 250–470)
UIBC: 248 ug/dL (ref 125–400)

## 2014-04-13 NOTE — Addendum Note (Signed)
Addended by: Rosine Abe on: 04/13/2014 09:20 PM   Modules accepted: Miquel Dunn

## 2014-04-13 NOTE — Patient Instructions (Signed)
Monitor your blood pressure at home as directed Bring your blood pressure log to your next follow up appointment

## 2014-04-13 NOTE — Progress Notes (Signed)
Subjective:    Patient ID: Teresa Bridges, female    DOB: 1965-11-17, 48 y.o.   MRN: [id number]  HPI  48 year old white female with history of hypothyroidism and history of preeclampsia with HELLP syndrome presents for routine CPX.  Patient denies significant interval medical history. Patient's screening blood work reviewed in detail. She has mild elevation in her AST. Patient is taking over-the-counter herb for hot flashes.  Her blood pressures sporadically elevated today. She reports intermittent mild headache.  She plans to follow-up with her gynecologist for breast exam and routine Pap / pelvic.  Her LDL higher.  She follows fairly healthy diet.  Review of Systems  Constitutional: Negative for activity change, appetite change and unexpected weight change.  Eyes: Negative for visual disturbance.  Respiratory: Negative for cough, chest tightness and shortness of breath.   Cardiovascular: Negative for chest pain.  Genitourinary: Negative for difficulty urinating.  Neurological: Negative for headaches.  Gastrointestinal: Negative for abdominal pain, heartburn melena or hematochezia Psych: Negative for depression or anxiety Endo:  Frequent hot flashes        Past Medical History  Diagnosis Date  . Allergy   . Hypothyroidism   . Endometriosis 1998  . Nephrolithiasis   . PIH (pregnancy induced hypertension)   . Pre-eclampsia   . In vitro fertilization   . Hyperlipidemia     History   Social History  . Marital Status: Married    Spouse Name: N/A    Number of Children: N/A  . Years of Education: N/A   Occupational History  . Not on file.   Social History Main Topics  . Smoking status: Never Smoker   . Smokeless tobacco: Never Used  . Alcohol Use: Yes     Comment: socially  . Drug Use: No  . Sexual Activity:    Partners: Male    Birth Control/ Protection: None   Other Topics Concern  . Not on file   Social History Narrative    Past Surgical History    Procedure Laterality Date  . Upper jaw surgery    . Laparoscopy  1998  . Breast cyst excision  1998    Family History  Problem Relation Age of Onset  . Arthritis Mother   . Heart disease Father   . Hyperlipidemia Father   . Hypertension Father   . Cancer Maternal Grandmother     breast  . Heart disease Maternal Grandfather   . Alcohol abuse Paternal Grandmother     Allergies  Allergen Reactions  . Minocycline Hcl     REACTION: Rash, Hives  . Sulfamethoxazole-Trimethoprim     REACTION: Rash.,  Hives    Current Outpatient Prescriptions on File Prior to Visit  Medication Sig Dispense Refill  . Estradiol (VAGIFEM) 10 MCG TABS vaginal tablet Place 1 tablet (10 mcg total) vaginally 2 (two) times a week. 24 tablet 3  . thyroid (ARMOUR THYROID) 15 MG tablet Take 1.5 tablets (22.5 mg total) by mouth daily. 135 tablet 0   No current facility-administered medications on file prior to visit.    BP 162/100 mmHg  Pulse 76  Temp(Src) 98.9 F (37.2 C) (Oral)  Ht 5' 2.75" (1.594 m)  Wt 114 lb (51.71 kg)  BMI 20.35 kg/m2    Objective:   Physical Exam  Constitutional: She is oriented to person, place, and time. She appears well-developed and well-nourished. No distress.  HENT:  Head: Normocephalic and atraumatic.  Right Ear: External ear normal.  Left Ear: External ear  normal.  Mouth/Throat: Oropharynx is clear and moist.  Eyes: Conjunctivae and EOM are normal. Pupils are equal, round, and reactive to light.  Neck: Normal range of motion. Neck supple.  No carotid bruit  Cardiovascular: Normal rate, regular rhythm and normal heart sounds.   No murmur heard. Pulmonary/Chest: Effort normal and breath sounds normal. She has no wheezes.  Abdominal: Bowel sounds are normal. She exhibits no mass. There is no tenderness.  No abdominal bruit  Musculoskeletal: She exhibits no edema.  Lymphadenopathy:    She has no cervical adenopathy.  Neurological: She is alert and oriented to  person, place, and time. No cranial nerve deficit.  Skin: Skin is warm and dry.  Psychiatric: She has a normal mood and affect. Her behavior is normal.          Assessment & Plan:

## 2014-04-13 NOTE — Assessment & Plan Note (Signed)
Patient experiencing frequent hot flashes. She is currently using over-the-counter herbs/supplements with some improvement. Patient advised to discuss possibly using gabapentin with her gynecologist.

## 2014-04-13 NOTE — Progress Notes (Signed)
Pre visit review using our clinic review tool, if applicable. No additional management support is needed unless otherwise documented below in the visit note. 

## 2014-04-13 NOTE — Assessment & Plan Note (Signed)
Reviewed adult health maintenance protocols.  Patient advised to follow low saturated fat diet. Breast exam, Pap and pelvic to be performed by her gynecologist.  Her blood pressure is chronically elevated today. Patient advised to monitor home blood pressure readings. Reassess in 2 months.  Patient has mildly elevated AST. Rule out chronic hepatitis. Obtain right upper quadrant ultrasound.

## 2014-04-13 NOTE — Assessment & Plan Note (Signed)
Stable. Continue same dose of armour thyroid.

## 2014-04-14 LAB — HEPATITIS B SURFACE ANTIGEN: HEP B S AG: NEGATIVE

## 2014-04-14 LAB — FERRITIN: Ferritin: 19.2 ng/mL (ref 10.0–291.0)

## 2014-04-14 LAB — HEPATITIS B SURFACE ANTIBODY,QUALITATIVE: Hep B S Ab: NEGATIVE

## 2014-04-14 LAB — HEPATITIS C ANTIBODY: HCV Ab: NEGATIVE

## 2014-04-18 LAB — CERULOPLASMIN: Ceruloplasmin: 27 mg/dL (ref 18–53)

## 2014-04-20 ENCOUNTER — Other Ambulatory Visit: Payer: Self-pay | Admitting: *Deleted

## 2014-04-20 MED ORDER — THYROID 15 MG PO TABS: 22.5000 mg | ORAL_TABLET | Freq: Every day | ORAL | Status: DC

## 2014-04-25 ENCOUNTER — Other Ambulatory Visit: Payer: Self-pay | Admitting: Internal Medicine

## 2014-05-20 ENCOUNTER — Other Ambulatory Visit: Payer: Self-pay | Admitting: Internal Medicine

## 2014-05-30 ENCOUNTER — Telehealth: Payer: Self-pay | Admitting: Internal Medicine

## 2014-05-30 MED ORDER — THYROID 15 MG PO TABS: ORAL_TABLET | ORAL | Status: DC

## 2014-05-30 NOTE — Telephone Encounter (Signed)
Pt states her order for ARMOUR THYROID 15 MG tablet has been delayed and not supposed  to arrive until tomorrow [facility].  However, pt is out of this med and wants to know if she can get a few tabs locally from [facility] [place]. Pt wants to know if  she should get a 15 day supply or just  the 3 tabs she needs until tomorrow.  Pls advise.

## 2014-05-30 NOTE — Telephone Encounter (Signed)
rx sent in to [facility]

## 2014-05-31 ENCOUNTER — Ambulatory Visit
Admission: RE | Admit: 2014-05-31 | Discharge: 2014-05-31 | Disposition: A | Discharge status: Home / Self Care | Payer: Commercial Managed Care - PPO | Source: Ambulatory Visit | Attending: Internal Medicine | Admitting: Internal Medicine

## 2014-06-22 ENCOUNTER — Ambulatory Visit: Payer: Commercial Managed Care - PPO | Admitting: Internal Medicine

## 2014-07-11 ENCOUNTER — Telehealth: Payer: Self-pay | Admitting: Internal Medicine

## 2014-07-11 NOTE — Telephone Encounter (Signed)
Left message for pt to call back  °

## 2014-07-11 NOTE — Telephone Encounter (Signed)
FYI Pt cancel her appt this week. Pt said bp is running fine

## 2014-07-13 ENCOUNTER — Ambulatory Visit: Payer: Commercial Managed Care - PPO | Admitting: Internal Medicine

## 2014-11-02 ENCOUNTER — Other Ambulatory Visit: Payer: Self-pay | Admitting: Internal Medicine

## 2015-01-29 ENCOUNTER — Other Ambulatory Visit: Payer: Self-pay | Admitting: Internal Medicine

## 2015-04-21 ENCOUNTER — Other Ambulatory Visit: Payer: Self-pay | Admitting: Orthopedic Surgery

## 2015-04-30 ENCOUNTER — Other Ambulatory Visit: Payer: Self-pay | Admitting: Internal Medicine

## 2015-05-12 ENCOUNTER — Ambulatory Visit: Payer: Commercial Managed Care - PPO | Admitting: Family Medicine

## 2015-05-12 ENCOUNTER — Ambulatory Visit (INDEPENDENT_AMBULATORY_CARE_PROVIDER_SITE_OTHER): Payer: Commercial Managed Care - PPO | Admitting: Internal Medicine

## 2015-05-12 ENCOUNTER — Encounter: Payer: Self-pay | Admitting: Internal Medicine

## 2015-05-12 VITALS — BP 120/80 | HR 74 | Temp 98.9°F | Wt 115.0 lb

## 2015-05-12 DIAGNOSIS — E039 Hypothyroidism, unspecified: Secondary | ICD-10-CM

## 2015-05-12 DIAGNOSIS — R232 Flushing: Secondary | ICD-10-CM

## 2015-05-12 DIAGNOSIS — N951 Menopausal and female climacteric states: Secondary | ICD-10-CM | POA: Diagnosis not present

## 2015-05-12 LAB — T3, FREE: T3, Free: 3.7 pg/mL (ref 2.3–4.2)

## 2015-05-12 LAB — T4, FREE: FREE T4: 0.62 ng/dL (ref 0.60–1.60)

## 2015-05-12 LAB — TSH: TSH: 0.99 u[IU]/mL (ref 0.35–4.50)

## 2015-05-12 NOTE — Progress Notes (Signed)
Subjective:    Patient ID: Teresa Bridges, female    DOB: Sep 27, 1965, 50 y.o.   MRN: [id number]  HPI  50 year old white female with history of hypothyroidism for routine follow-up. Patient denies any significant interval medical history. She has upcoming right foot surgery/bunionectomy.  Her weight has been stable. She denies symptoms of tremor or anxiety.  Patient recently seen by her gynecologist. She reports her routine exam was normal. She has occasional issues with hot flashes. She is taking several over-the-counter supplements which seems to be helping.  Review of Systems No change in weight    Past Medical History  Diagnosis Date  . Allergy   . Hypothyroidism   . Endometriosis 1998  . Nephrolithiasis   . PIH (pregnancy induced hypertension)   . Pre-eclampsia   . In vitro fertilization   . Hyperlipidemia     Social History   Social History  . Marital Status: Married    Spouse Name: N/A  . Number of Children: 1  . Years of Education: N/A   Occupational History  . Not on file.   Social History Main Topics  . Smoking status: Never Smoker   . Smokeless tobacco: Never Used  . Alcohol Use: Yes     Comment: socially  . Drug Use: No  . Sexual Activity:    Partners: Male    Birth Control/ Protection: None   Other Topics Concern  . Not on file   Social History Narrative   1 son - Kevan Ny (40 years old)    Past Surgical History  Procedure Laterality Date  . Upper jaw surgery    . Laparoscopy  1998  . Breast cyst excision  1998    Family History  Problem Relation Age of Onset  . Arthritis Mother   . Heart disease Father   . Hyperlipidemia Father   . Hypertension Father   . Cancer Maternal Grandmother     breast  . Heart disease Maternal Grandfather   . Alcohol abuse Paternal Grandmother     Allergies  Allergen Reactions  . Minocycline Hcl     REACTION: Rash, Hives  . Sulfamethoxazole-Trimethoprim     REACTION: Rash.,  Hives    Current  Outpatient Prescriptions on File Prior to Visit  Medication Sig Dispense Refill  . ARMOUR THYROID 15 MG tablet TAKE ONE AND ONE-HALF TABLETS (22.5 MG TOTAL) DAILY (NEED OFFICE VISIT) 135 tablet 0  . Estradiol (VAGIFEM) 10 MCG TABS vaginal tablet Place 1 tablet (10 mcg total) vaginally 2 (two) times a week. 24 tablet 3   No current facility-administered medications on file prior to visit.    BP 120/80 mmHg  Pulse 74  Temp(Src) 98.9 F (37.2 C) (Oral)  Wt 115 lb (52.164 kg)    Objective:   Physical Exam  Constitutional: She is oriented to person, place, and time. She appears well-developed and well-nourished.  HENT:  Head: Normocephalic and atraumatic.  Neck: Normal range of motion. Neck supple. No thyromegaly present.  Cardiovascular: Normal rate, regular rhythm and normal heart sounds.   Pulmonary/Chest: Effort normal and breath sounds normal. She has no wheezes.  Neurological: She is alert and oriented to person, place, and time. No cranial nerve deficit.  Psychiatric: She has a normal mood and affect. Her behavior is normal.        Assessment & Plan:   1.  Hypothyroidism - negative thyroid antibodies 2011 2.  Hot flashes  Patient is clinically stable. Monitor thyroid function studies. Adjust Armour  Thyroid dose accordingly.  Patient experiencing mild intermittent hot flashes which is controlled with over-the-counter supplements. We discussed using low-dose hormone replacement should severity of her hot flashes significantly increase.  She prefers to avoid HRT.

## 2015-05-12 NOTE — Progress Notes (Signed)
Pre visit review using our clinic review tool, if applicable. No additional management support is needed unless otherwise documented below in the visit note. 

## 2015-05-12 NOTE — Patient Instructions (Signed)
Our office will contact you regarding blood test results Your thyroid medication dose will be adjusted if needed

## 2015-05-13 ENCOUNTER — Other Ambulatory Visit: Payer: Self-pay | Admitting: Internal Medicine

## 2015-05-13 MED ORDER — THYROID 15 MG PO TABS: ORAL_TABLET | ORAL | Status: DC

## 2015-05-23 ENCOUNTER — Encounter (HOSPITAL_BASED_OUTPATIENT_CLINIC_OR_DEPARTMENT_OTHER): Payer: Self-pay | Admitting: *Deleted

## 2015-05-25 ENCOUNTER — Encounter (HOSPITAL_BASED_OUTPATIENT_CLINIC_OR_DEPARTMENT_OTHER)
Admission: RE | Disposition: A | Discharge status: Home / Self Care | Payer: Self-pay | Source: Ambulatory Visit | Attending: Orthopedic Surgery

## 2015-05-25 ENCOUNTER — Ambulatory Visit (HOSPITAL_BASED_OUTPATIENT_CLINIC_OR_DEPARTMENT_OTHER): Payer: Commercial Managed Care - PPO | Admitting: Certified Registered"

## 2015-05-25 ENCOUNTER — Ambulatory Visit (HOSPITAL_BASED_OUTPATIENT_CLINIC_OR_DEPARTMENT_OTHER)
Admission: RE | Admit: 2015-05-25 | Discharge: 2015-05-25 | Disposition: A | Discharge status: Home / Self Care | Payer: Commercial Managed Care - PPO | Source: Ambulatory Visit | Attending: Orthopedic Surgery | Admitting: Orthopedic Surgery

## 2015-05-25 ENCOUNTER — Encounter (HOSPITAL_BASED_OUTPATIENT_CLINIC_OR_DEPARTMENT_OTHER): Payer: Self-pay | Admitting: Certified Registered"

## 2015-05-25 DIAGNOSIS — M25572 Pain in left ankle and joints of left foot: Secondary | ICD-10-CM

## 2015-05-25 DIAGNOSIS — Q6621 Congenital metatarsus primus varus: Secondary | ICD-10-CM | POA: Insufficient documentation

## 2015-05-25 DIAGNOSIS — E785 Hyperlipidemia, unspecified: Secondary | ICD-10-CM | POA: Diagnosis not present

## 2015-05-25 DIAGNOSIS — E039 Hypothyroidism, unspecified: Secondary | ICD-10-CM | POA: Diagnosis not present

## 2015-05-25 DIAGNOSIS — M2012 Hallux valgus (acquired), left foot: Secondary | ICD-10-CM | POA: Insufficient documentation

## 2015-05-25 DIAGNOSIS — D2122 Benign neoplasm of connective and other soft tissue of left lower limb, including hip: Secondary | ICD-10-CM | POA: Insufficient documentation

## 2015-05-25 DIAGNOSIS — Z79899 Other long term (current) drug therapy: Secondary | ICD-10-CM | POA: Diagnosis not present

## 2015-05-25 DIAGNOSIS — I10 Essential (primary) hypertension: Secondary | ICD-10-CM | POA: Insufficient documentation

## 2015-05-25 HISTORY — PX: EXCISION MASS LOWER EXTREMETIES: SHX6705

## 2015-05-25 HISTORY — PX: BUNIONECTOMY: SHX129

## 2015-05-25 HISTORY — PX: METATARSAL OSTEOTOMY: SHX1641

## 2015-05-25 HISTORY — DX: Headache, unspecified: R51.9

## 2015-05-25 HISTORY — DX: Headache: R51

## 2015-05-25 SURGERY — OSTEOTOMY, METATARSAL BONE
Anesthesia: Regional | Site: Foot | Laterality: Left

## 2015-05-25 MED ORDER — MIDAZOLAM HCL 2 MG/2ML IJ SOLN
INTRAMUSCULAR | Status: AC
  Filled 2015-05-25: qty 2

## 2015-05-25 MED ORDER — GLYCOPYRROLATE 0.2 MG/ML IJ SOLN: 0.2000 mg | Freq: Once | INTRAMUSCULAR | Status: DC | PRN

## 2015-05-25 MED ORDER — LIDOCAINE HCL (CARDIAC) 20 MG/ML IV SOLN
INTRAVENOUS | Status: DC | PRN
  Administered 2015-05-25: 20 mg via INTRAVENOUS

## 2015-05-25 MED ORDER — CHLORHEXIDINE GLUCONATE 4 % EX LIQD: 60.0000 mL | Freq: Once | CUTANEOUS | Status: DC

## 2015-05-25 MED ORDER — OXYCODONE HCL 5 MG PO TABS: 5.0000 mg | ORAL_TABLET | ORAL | Status: DC | PRN

## 2015-05-25 MED ORDER — OXYCODONE HCL 5 MG PO TABS: 5.0000 mg | ORAL_TABLET | Freq: Once | ORAL | Status: DC | PRN

## 2015-05-25 MED ORDER — ONDANSETRON HCL 4 MG/2ML IJ SOLN: 4.0000 mg | Freq: Four times a day (QID) | INTRAMUSCULAR | Status: DC | PRN

## 2015-05-25 MED ORDER — PROPOFOL 10 MG/ML IV BOLUS
INTRAVENOUS | Status: DC | PRN
  Administered 2015-05-25: 150 mg via INTRAVENOUS

## 2015-05-25 MED ORDER — FENTANYL CITRATE (PF) 100 MCG/2ML IJ SOLN
50.0000 ug | INTRAMUSCULAR | Status: DC | PRN
  Administered 2015-05-25: 100 ug via INTRAVENOUS

## 2015-05-25 MED ORDER — HYDROMORPHONE HCL 1 MG/ML IJ SOLN: 0.2500 mg | INTRAMUSCULAR | Status: DC | PRN

## 2015-05-25 MED ORDER — CEFAZOLIN SODIUM-DEXTROSE 2-3 GM-% IV SOLR
INTRAVENOUS | Status: AC
  Filled 2015-05-25: qty 50

## 2015-05-25 MED ORDER — SUCCINYLCHOLINE CHLORIDE 20 MG/ML IJ SOLN
INTRAMUSCULAR | Status: AC
  Filled 2015-05-25: qty 1

## 2015-05-25 MED ORDER — DEXAMETHASONE SODIUM PHOSPHATE 10 MG/ML IJ SOLN
INTRAMUSCULAR | Status: AC
  Filled 2015-05-25: qty 1

## 2015-05-25 MED ORDER — ONDANSETRON HCL 4 MG/2ML IJ SOLN
INTRAMUSCULAR | Status: AC
  Filled 2015-05-25: qty 2

## 2015-05-25 MED ORDER — MIDAZOLAM HCL 2 MG/2ML IJ SOLN
1.0000 mg | INTRAMUSCULAR | Status: DC | PRN
  Administered 2015-05-25 (×2): 2 mg via INTRAVENOUS

## 2015-05-25 MED ORDER — CEFAZOLIN SODIUM-DEXTROSE 2-3 GM-% IV SOLR
2.0000 g | INTRAVENOUS | Status: AC
  Administered 2015-05-25: 2 g via INTRAVENOUS

## 2015-05-25 MED ORDER — LACTATED RINGERS IV SOLN
INTRAVENOUS | Status: DC
  Administered 2015-05-25 (×2): via INTRAVENOUS

## 2015-05-25 MED ORDER — PROPOFOL 500 MG/50ML IV EMUL
INTRAVENOUS | Status: AC
  Filled 2015-05-25: qty 50

## 2015-05-25 MED ORDER — SCOPOLAMINE 1 MG/3DAYS TD PT72: 1.0000 | MEDICATED_PATCH | Freq: Once | TRANSDERMAL | Status: DC

## 2015-05-25 MED ORDER — SODIUM CHLORIDE 0.9 % IV SOLN: INTRAVENOUS | Status: DC

## 2015-05-25 MED ORDER — EPHEDRINE SULFATE 50 MG/ML IJ SOLN
INTRAMUSCULAR | Status: DC | PRN
  Administered 2015-05-25: 10 mg via INTRAVENOUS

## 2015-05-25 MED ORDER — DEXAMETHASONE SODIUM PHOSPHATE 10 MG/ML IJ SOLN
INTRAMUSCULAR | Status: DC | PRN
  Administered 2015-05-25: 10 mg via INTRAVENOUS

## 2015-05-25 MED ORDER — DOCUSATE SODIUM 100 MG PO CAPS: 100.0000 mg | ORAL_CAPSULE | Freq: Two times a day (BID) | ORAL | Status: DC

## 2015-05-25 MED ORDER — ONDANSETRON HCL 4 MG/2ML IJ SOLN
INTRAMUSCULAR | Status: DC | PRN
  Administered 2015-05-25: 4 mg via INTRAVENOUS

## 2015-05-25 MED ORDER — FENTANYL CITRATE (PF) 100 MCG/2ML IJ SOLN
INTRAMUSCULAR | Status: AC
Start: 2015-05-25 — End: 2015-05-25
  Filled 2015-05-25: qty 2

## 2015-05-25 MED ORDER — SODIUM CHLORIDE 0.9 % IR SOLN
Status: DC | PRN
  Administered 2015-05-25: 1

## 2015-05-25 MED ORDER — OXYCODONE HCL 5 MG/5ML PO SOLN: 5.0000 mg | Freq: Once | ORAL | Status: DC | PRN

## 2015-05-25 MED ORDER — SENNA 8.6 MG PO TABS: 2.0000 | ORAL_TABLET | Freq: Two times a day (BID) | ORAL | Status: DC

## 2015-05-25 MED ORDER — BUPIVACAINE-EPINEPHRINE (PF) 0.5% -1:200000 IJ SOLN
INTRAMUSCULAR | Status: DC | PRN
  Administered 2015-05-25: 30 mL via PERINEURAL

## 2015-05-25 MED ORDER — LIDOCAINE HCL (CARDIAC) 20 MG/ML IV SOLN
INTRAVENOUS | Status: AC
  Filled 2015-05-25: qty 5

## 2015-05-25 SURGICAL SUPPLY — 81 items
BANDAGE ESMARK 6X9 LF (GAUZE/BANDAGES/DRESSINGS) ×2 IMPLANT
BIT DRILL 2X3.5 HAND QK RELEAS (BIT) ×2 IMPLANT
BIT DRILL 2X3.5 QUICK RELEASE (BIT) ×4
BLADE AVERAGE 25MMX9MM (BLADE) ×1
BLADE AVERAGE 25X9 (BLADE) ×3 IMPLANT
BLADE MICRO SAGITTAL (BLADE) IMPLANT
BLADE MINI RND TIP GREEN BEAV (BLADE) IMPLANT
BLADE OSC/SAG .038X5.5 CUT EDG (BLADE) IMPLANT
BLADE SURG 15 STRL LF DISP TIS (BLADE) ×6 IMPLANT
BLADE SURG 15 STRL SS (BLADE) ×6
BNDG COHESIVE 4X5 TAN STRL (GAUZE/BANDAGES/DRESSINGS) ×4 IMPLANT
BNDG COHESIVE 6X5 TAN STRL LF (GAUZE/BANDAGES/DRESSINGS) IMPLANT
BNDG CONFORM 2 STRL LF (GAUZE/BANDAGES/DRESSINGS) IMPLANT
BNDG CONFORM 3 STRL LF (GAUZE/BANDAGES/DRESSINGS) ×4 IMPLANT
BNDG ESMARK 6X9 LF (GAUZE/BANDAGES/DRESSINGS) ×4
CHLORAPREP W/TINT 26ML (MISCELLANEOUS) ×4 IMPLANT
COUNTERSINK MULTI SCREW (BIT) ×4
COVER BACK TABLE 60X90IN (DRAPES) ×4 IMPLANT
CUFF TOURNIQUET SINGLE 24IN (TOURNIQUET CUFF) ×4 IMPLANT
DRAPE EXTREMITY T 121X128X90 (DRAPE) ×4 IMPLANT
DRAPE OEC MINIVIEW 54X84 (DRAPES) ×4 IMPLANT
DRAPE U-SHAPE 47X51 STRL (DRAPES) ×4 IMPLANT
DRSG MEPITEL 4X7.2 (GAUZE/BANDAGES/DRESSINGS) ×4 IMPLANT
DRSG PAD ABDOMINAL 8X10 ST (GAUZE/BANDAGES/DRESSINGS) ×4 IMPLANT
ELECT REM PT RETURN 9FT ADLT (ELECTROSURGICAL) ×4
ELECTRODE REM PT RTRN 9FT ADLT (ELECTROSURGICAL) ×2 IMPLANT
GAUZE SPONGE 4X4 12PLY STRL (GAUZE/BANDAGES/DRESSINGS) ×4 IMPLANT
GLOVE BIO SURGEON STRL SZ8 (GLOVE) ×4 IMPLANT
GLOVE BIOGEL M STRL SZ7.5 (GLOVE) ×4 IMPLANT
GLOVE BIOGEL PI IND STRL 7.0 (GLOVE) ×2 IMPLANT
GLOVE BIOGEL PI IND STRL 7.5 (GLOVE) ×2 IMPLANT
GLOVE BIOGEL PI IND STRL 8 (GLOVE) ×4 IMPLANT
GLOVE BIOGEL PI INDICATOR 7.0 (GLOVE) ×2
GLOVE BIOGEL PI INDICATOR 7.5 (GLOVE) ×2
GLOVE BIOGEL PI INDICATOR 8 (GLOVE) ×4
GLOVE ECLIPSE 7.5 STRL STRAW (GLOVE) ×4 IMPLANT
GLOVE EXAM NITRILE MD LF STRL (GLOVE) IMPLANT
GOWN STRL REUS W/ TWL LRG LVL3 (GOWN DISPOSABLE) ×4 IMPLANT
GOWN STRL REUS W/ TWL XL LVL3 (GOWN DISPOSABLE) ×4 IMPLANT
GOWN STRL REUS W/TWL LRG LVL3 (GOWN DISPOSABLE) ×4
GOWN STRL REUS W/TWL XL LVL3 (GOWN DISPOSABLE) ×4
GUIDEWIRE ORTHO 0.054X7 SS (WIRE) IMPLANT
K-WIRE DBL END .054 LG (WIRE) ×4 IMPLANT
NEEDLE HYPO 22GX1.5 SAFETY (NEEDLE) IMPLANT
NEEDLE HYPO 25X1 1.5 SAFETY (NEEDLE) IMPLANT
NS IRRIG 1000ML POUR BTL (IV SOLUTION) ×4 IMPLANT
PACK BASIN DAY SURGERY FS (CUSTOM PROCEDURE TRAY) ×4 IMPLANT
PAD CAST 4YDX4 CTTN HI CHSV (CAST SUPPLIES) ×2 IMPLANT
PADDING CAST ABS 4INX4YD NS (CAST SUPPLIES)
PADDING CAST ABS COTTON 4X4 ST (CAST SUPPLIES) IMPLANT
PADDING CAST COTTON 4X4 STRL (CAST SUPPLIES) ×2
PADDING CAST COTTON 6X4 STRL (CAST SUPPLIES) IMPLANT
PENCIL BUTTON HOLSTER BLD 10FT (ELECTRODE) ×4 IMPLANT
SANITIZER HAND PURELL 535ML FO (MISCELLANEOUS) ×4 IMPLANT
SCREW BONE NL 2.3X14MM HEXA (Screw) ×2 IMPLANT
SCREW BONE NL 2.3X20MM HEXA (Screw) ×2 IMPLANT
SCREW COUNTERSINK MULTI SCREW (BIT) ×2 IMPLANT
SCREW NON LOCK 2.3X14 (Screw) ×4 IMPLANT
SCREW NONLOCK 2.3X20MM (Screw) ×4 IMPLANT
SHEET MEDIUM DRAPE 40X70 STRL (DRAPES) ×4 IMPLANT
SLEEVE SCD COMPRESS KNEE MED (MISCELLANEOUS) ×4 IMPLANT
SPLINT FAST PLASTER 5X30 (CAST SUPPLIES)
SPLINT PLASTER CAST FAST 5X30 (CAST SUPPLIES) IMPLANT
SPONGE LAP 18X18 X RAY DECT (DISPOSABLE) ×4 IMPLANT
STOCKINETTE 6  STRL (DRAPES) ×2
STOCKINETTE 6 STRL (DRAPES) ×2 IMPLANT
SUCTION FRAZIER HANDLE 10FR (MISCELLANEOUS) ×2
SUCTION TUBE FRAZIER 10FR DISP (MISCELLANEOUS) ×2 IMPLANT
SUT ETHILON 3 0 PS 1 (SUTURE) ×4 IMPLANT
SUT MNCRL AB 3-0 PS2 18 (SUTURE) ×4 IMPLANT
SUT VIC AB 0 SH 27 (SUTURE) IMPLANT
SUT VIC AB 2-0 SH 27 (SUTURE)
SUT VIC AB 2-0 SH 27XBRD (SUTURE) IMPLANT
SUT VICRYL 0 UR6 27IN ABS (SUTURE) IMPLANT
SYR BULB 3OZ (MISCELLANEOUS) ×4 IMPLANT
SYR CONTROL 10ML LL (SYRINGE) IMPLANT
TOWEL OR 17X24 6PK STRL BLUE (TOWEL DISPOSABLE) ×8 IMPLANT
TUBE CONNECTING 20'X1/4 (TUBING) ×1
TUBE CONNECTING 20X1/4 (TUBING) ×3 IMPLANT
UNDERPAD 30X30 (UNDERPADS AND DIAPERS) ×4 IMPLANT
YANKAUER SUCT BULB TIP NO VENT (SUCTIONS) IMPLANT

## 2015-05-25 NOTE — H&P (Signed)
Teresa Bridges is an 50 y.o. female.   Chief Complaint: left foot bunion HPI:  50 y/o female without significant PMH c/o a painful bunion for many years.  It has worsened substantially over the last year.  She also notes a small skin lesion on the lateral foot on that same side.  She has failed non op treatment and presents now for operative care.  Past Medical History  Diagnosis Date  . Allergy   . Hypothyroidism   . Endometriosis 1998  . Nephrolithiasis   . PIH (pregnancy induced hypertension)   . Pre-eclampsia   . In vitro fertilization   . Hyperlipidemia   . Headache     Past Surgical History  Procedure Laterality Date  . Upper jaw surgery    . Laparoscopy  1998  . Breast cyst excision  1998    Family History  Problem Relation Age of Onset  . Arthritis Mother   . Heart disease Father   . Hyperlipidemia Father   . Hypertension Father   . Cancer Maternal Grandmother     breast  . Heart disease Maternal Grandfather   . Alcohol abuse Paternal Grandmother    Social History:  reports that she has never smoked. She has never used smokeless tobacco. She reports that she drinks alcohol. She reports that she does not use illicit drugs.  Allergies:  Allergies  Allergen Reactions  . Minocycline Hcl     REACTION: Rash, Hives  . Sulfamethoxazole-Trimethoprim     REACTION: Rash.,  Hives    Medications Prior to Admission  Medication Sig Dispense Refill  . thyroid (ARMOUR THYROID) 15 MG tablet TAKE ONE AND ONE-HALF TABLETS (22.5 MG TOTAL) DAILY 135 tablet 3  . Estradiol (VAGIFEM) 10 MCG TABS vaginal tablet Place 1 tablet (10 mcg total) vaginally 2 (two) times a week. 24 tablet 3    No results found for this or any previous visit (from the past 48 hour(s)). No results found.  ROS  No recent f/c/n/v/wt loss  Blood pressure 139/85, pulse 83, temperature 98.3 F (36.8 C), temperature source Oral, resp. rate 20, height 5' 2.75" (1.594 m), weight 51.256 kg (113 lb), last  menstrual period 03/07/2015, SpO2 100 %. Physical Exam  wn wd female in nad.  A and O x 4.  Mood and affect normal.  EOMI.  resp unlabored.  L foot with healthy and intact skin.  Small mass in the skin over the EDB muscle.  NTTP.  sens to LT intact.  5/5 strength in PF and DF of the ankle and toes.  No lymphadenopathy.  Moderat bunion deformity.  No lesser toe instability.  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 To OR for modified McBride and scarf osteotomy with possible Akin.    L dorsal foot skin lesion  - to OR for excision of mass.  The risks and benefits of the alternative treatment options have been discussed in detail.  The patient wishes to proceed with surgery and specifically understands risks of bleeding, infection, nerve damage, blood clots, need for additional surgery, amputation and death.   Wylene Simmer June 18, 2015, 11:19 AM

## 2015-05-25 NOTE — Anesthesia Preprocedure Evaluation (Addendum)
Anesthesia Evaluation  Patient identified by MRN, date of birth, ID band Patient awake    Reviewed: Allergy & Precautions, NPO status , Patient's Chart, lab work & pertinent test results  Airway Mallampati: II   Neck ROM: full    Dental   Pulmonary    breath sounds clear to auscultation       Cardiovascular hypertension,  Rhythm:regular Rate:Normal     Neuro/Psych  Headaches,    GI/Hepatic   Endo/Other  Hypothyroidism   Renal/GU      Musculoskeletal   Abdominal   Peds  Hematology   Anesthesia Other Findings   Reproductive/Obstetrics                            Anesthesia Physical Anesthesia Plan  ASA: II  Anesthesia Plan: General and Regional   Post-op Pain Management: MAC Combined w/ Regional for Post-op pain   Induction: Intravenous  Airway Management Planned: LMA  Additional Equipment:   Intra-op Plan:   Post-operative Plan:   Informed Consent: I have reviewed the patients History and Physical, chart, labs and discussed the procedure including the risks, benefits and alternatives for the proposed anesthesia with the patient or authorized representative who has indicated his/her understanding and acceptance.     Plan Discussed with: CRNA, Anesthesiologist and Surgeon  Anesthesia Plan Comments:         Anesthesia Quick Evaluation

## 2015-05-25 NOTE — Progress Notes (Signed)
Assisted Dr. Hodierne with left, ultrasound guided, popliteal block. Side rails up, monitors on throughout procedure. See vital signs in flow sheet. Tolerated Procedure well. 

## 2015-05-25 NOTE — Transfer of Care (Signed)
Immediate Anesthesia Transfer of Care Note  Patient: Teresa Bridges  Procedure(s) Performed: Procedure(s): LEFT FIRST MT SCARF OSTEOTOMY (Left) MODIFIED MCBRIDE BUNIONECTOMY (Left) EXCISION MASS LEFT LATERAL FOOT (Left)  Patient Location: PACU  Anesthesia Type:GA combined with regional for post-op pain  Level of Consciousness: awake and patient cooperative  Airway & Oxygen Therapy: Patient Spontanous Breathing and Patient connected to face mask oxygen  Post-op Assessment: Report given to RN and Post -op Vital signs reviewed and stable  Post vital signs: Reviewed and stable  Last Vitals:  Filed Vitals:   05/25/15 1246 05/25/15 1247  BP: 96/56   Pulse:  81  Temp:    Resp:  9    Complications: No apparent anesthesia complications

## 2015-05-25 NOTE — Anesthesia Postprocedure Evaluation (Signed)
Anesthesia Post Note  Patient: Christeen Douglas  Procedure(s) Performed: Procedure(s) (LRB): LEFT FIRST MT SCARF OSTEOTOMY (Left) MODIFIED MCBRIDE BUNIONECTOMY (Left) EXCISION MASS LEFT LATERAL FOOT (Left)  Patient location during evaluation: PACU Anesthesia Type: General Level of consciousness: awake and alert and patient cooperative Pain management: pain level controlled Vital Signs Assessment: post-procedure vital signs reviewed and stable Respiratory status: spontaneous breathing and respiratory function stable Cardiovascular status: stable Anesthetic complications: no    Last Vitals:  Filed Vitals:   05/25/15 1315 05/25/15 1330  BP: 117/82 119/75  Pulse: 85 82  Temp:    Resp: 11 12    Last Pain:  Filed Vitals:   05/25/15 1334  PainSc: 0-No pain                 Skyelyn Scruggs S

## 2015-05-25 NOTE — Brief Op Note (Signed)
05/25/2015  12:55 PM  PATIENT:  Teresa Bridges  50 y.o. female  PRE-OPERATIVE DIAGNOSIS:  left bunion with hallux valgus and metatarsus primus varus, left lateral foot mass  POST-OPERATIVE DIAGNOSIS:  left bunion with hallux valgus and metatarsus primus varus, left lateral foot mass  Procedure(s): 1.  LEFT FIRST MT SCARF OSTEOTOMY 2.  Left MODIFIED MCBRIDE BUNIONECTOMY (separate incision) 3.  EXCISION of cutaneous MASS LEFT LATERAL FOOT 4.  AP and lateral xrays of the left foot  SURGEON:  Wylene Simmer, MD  ASSISTANT: Mechele Claude, PA-C  ANESTHESIA:   General, regional  EBL:  minimal   TOURNIQUET:   Total Tourniquet Time Documented: Thigh (Left) - 45 minutes Total: Thigh (Left) - 45 minutes  COMPLICATIONS:  None apparent  DISPOSITION:  Extubated, awake and stable to recovery.  DICTATION ID:  [id number]

## 2015-05-25 NOTE — Op Note (Signed)
NAMEKATIANNA, CULVERSON NO.:  [id number]  MEDICAL RECORD NO.:  [id number]  LOCATION:                                 FACILITY:  PHYSICIAN:  Wylene Simmer, MD             DATE OF BIRTH:  DATE OF PROCEDURE:  05/25/2015 DATE OF DISCHARGE:                              OPERATIVE REPORT   PREOPERATIVE DIAGNOSES: 1. Left foot bunion deformity with hallux valgus and metatarsus primus     varus. 2. Left lateral foot mass.  POSTOPERATIVE DIAGNOSES: 1. Left foot bunion deformity with hallux valgus and metatarsus primus     varus. 2. Left lateral foot mass.  PROCEDURE: 1. Left first metatarsal Scarf osteotomy. 2. Left modified McBride bunionectomy through a separate incision. 3. Excision of cutaneous mass of the left lateral foot. 4. AP and lateral radiographs of the left foot.  SURGEON:  Wylene Simmer, M.D.  ASSISTANT:  Mechele Claude, PA-C.  ANESTHESIA:  General, regional.  ESTIMATED BLOOD LOSS:  Minimal.  TOURNIQUET TIME:  45 minutes at 250 mmHg.  COMPLICATIONS:  None apparent.  DISPOSITION:  Extubated, awake, and stable to recovery.  INDICATIONS FOR PROCEDURE:  The patient is a 50 year old female, who complains of a painful left foot bunion deformity.  She has failed non- operative treatment to date and presents today for surgical correction. She understands the risks and benefits, the alternative treatment options, and elects to proceed with surgical treatment.  She also notes a painful mass at the dorsal lateral aspect of her left foot.  This appears to be a small cutaneous mass that is only about 4 mm across. She desires surgical excision of this painful mass.  She understands the risks and benefits, the alternative treatment options, and elects to proceed with surgical treatment.  She specifically understands the risks of bleeding, infection, nerve damage, blood clots, need for additional surgery, continued pain, recurrence of deformity, amputation, and  death.  PROCEDURE IN DETAIL:  After preoperative consent was obtained and the correct operative site was identified, the patient was brought to the operating room and placed supine on the operating table.  General anesthesia was induced.  Preoperative antibiotics were administered. Surgical time-out was taken.  Left lower extremity was prepped and draped in standard sterile fashion.  The tourniquet around the thigh. The extremity was exsanguinated and the tourniquet was inflated to 250 mmHg.  An ellipsoid incision was marked on the skin, around the cutaneous mass.  The incision was made and sharp dissection was carried down through the skin to the level of subcutaneous tissue.  The mass was a well-circumscribed oval-shaped mass that was about 2 mm x 3 mm.  It was excised in its entirety.  It had no evident adherence to the surrounding soft tissues.  The wound was then irrigated and closed with horizontal mattress sutures and 3-0 nylon.  Attention was then turned to the dorsum of the foot, where longitudinal incision was made in the first webspace.  Sharp dissection was carried down through skin and subcutaneous tissue.  The inner metatarsal ligament was divided under direct vision.  An arthrotomy was then made between the lateral sesamoid and  first metatarsal head.  Several perforations were then made in the lateral joint capsule.  The hallux could then be positioned in 20 degrees of varus passively.  Attention was then turned to the medial aspect of the foot, where a longitudinal incision was made.  The incision was carried down through the skin and subcutaneous tissue to the level the medial joint capsule. This was incised in line with its fibers and elevated plantarly and dorsally, exposing the hypertrophic medial eminence.  The medial eminence was then resected in line with the first metatarsal shaft, using the oscillating saw.  A Scarf osteotomy was then made in the  first metatarsal shaft, using the oscillating saw.  The small wedge of bone was removed proximally.  The osteotomy site was mobilized and the metatarsal head was translated laterally to correct the intermetatarsal angle.  The osteotomy was provisionally held with tenaculum.  AP and lateral radiograph showed appropriate position of the osteotomy.  The osteotomy was then fixed with a 2.3 mm unicortical Accu Med screw distally and a bicortical screw proximally.  The overhanging bone was trimmed with the oscillating saw.  Final AP and lateral radiographs confirmed appropriate correction of the bunion deformity.  Hardware is appropriately positioned at the end of the appropriate length.  No other acute injuries are noted.  The wound was irrigated copiously.  The medial joint capsule was repaired with imbricating sutures of 2-0 Vicryl.  Subcutaneous tissues were approximated with inverted simple sutures of 3-0 Monocryl and a running 3-0 nylon was used to close the skin incision.  The dorsal incision was closed with Monocryl and nylon, after irrigating copiously. Sterile dressings were applied, followed by a bunion wrap.  The tourniquet was released at 45 minutes after application of the dressings.  The patient was awakened from anesthesia and transported to the recovery room in stable condition.  FOLLOWUP PLAN:  The patient will be nonweightbearing as tolerated on her heel in the Darco wedge style shoe.  She will follow up with me in the office in 2 weeks for suture removal and a toe spacer.  RADIOGRAPHS:  AP and lateral radiographs of the left foot were obtained intraoperatively.  These show interval correction of the hallux valgus and metatarsus primus varus deformities.  No acute injuries are noted. Hardware is appropriately positioned and of the appropriate length.  Mechele Claude, PA-C, was present and scrubbed for the duration of the case.  His assistance was essential in positioning  the patient, prepping and draping, gaining and maintaining exposure, performing the operation, closing and dressing the wounds, and applying the splint.     Wylene Simmer, MD     JH/MEDQ  D:  05/25/2015  T:  05/25/2015  Job:  [id number]

## 2015-05-25 NOTE — Anesthesia Procedure Notes (Addendum)
Procedure Name: LMA Insertion Date/Time: 05/25/2015 11:44 AM Performed by: BLOCKER, TIMOTHY D Pre-anesthesia Checklist: Patient identified, Emergency Drugs available, Suction available and Patient being monitored Patient Re-evaluated:Patient Re-evaluated prior to inductionOxygen Delivery Method: Circle System Utilized Preoxygenation: Pre-oxygenation with 100% oxygen Intubation Type: IV induction Ventilation: Mask ventilation without difficulty LMA: LMA inserted LMA Size: 4.0 Number of attempts: 1 Airway Equipment and Method: Bite block Placement Confirmation: positive ETCO2 Tube secured with: Tape Dental Injury: Teeth and Oropharynx as per pre-operative assessment    Anesthesia Regional Block:  Popliteal block  Pre-Anesthetic Checklist: ,, timeout performed, Correct Patient, Correct Site, Correct Laterality, Correct Procedure, Correct Position, site marked, Risks and benefits discussed,  Surgical consent,  Pre-op evaluation,  At surgeon's request and post-op pain management  Laterality: Left  Prep: chloraprep       Needles:  Injection technique: Single-shot  Needle Type: Echogenic Stimulator Needle     Needle Length: 9cm 9 cm Needle Gauge: 21 and 21 G    Additional Needles:  Procedures: ultrasound guided (picture in chart) and nerve stimulator Popliteal block  Nerve Stimulator or Paresthesia:  Response: plantar flexion of foot, 0.45 mA,   Additional Responses:   Narrative:  Start time: 05/25/2015 11:21 AM End time: 05/25/2015 11:32 AM Injection made incrementally with aspirations every 5 mL.  Performed by: Personally  Anesthesiologist: Reve Crocket  Additional Notes: Functioning IV was confirmed and monitors were applied.  A 36mm 21ga Arrow echogenic stimulator needle was used. Sterile prep and drape,hand hygiene and sterile gloves were used.  Negative aspiration and negative test dose prior to incremental administration of local anesthetic. The patient tolerated  the procedure well.  Ultrasound guidance: relevent anatomy identified, needle position confirmed, local anesthetic spread visualized around nerve(s), vascular puncture avoided.  Image printed for medical record.

## 2015-05-25 NOTE — Discharge Instructions (Addendum)
Teresa Simmer, MD [place]  Please read the following information regarding your care after surgery.  Medications  You only need a prescription for the narcotic pain medicine (ex. oxycodone, Percocet, Norco).  All of the other medicines listed below are available over the counter. X acetominophen (Tylenol) 650 mg every 4-6 hours as you need for minor pain X oxycodone as prescribed for moderate to severe pain X aleve 2 tablets twice a day for 5 days   Narcotic pain medicine (ex. oxycodone, Percocet, Vicodin) will cause constipation.  To prevent this problem, take the following medicines while you are taking any pain medicine. X docusate sodium (Colace) 100 mg twice a day X senna (Senokot) 2 tablets twice a day    Weight Bearing X Bear weight only on the heel of your operated foot in the darco wedge post-op shoe.   Cast / Splint / Dressing X Keep your splint or cast clean and dry.  Dont put anything (coat hanger, pencil, etc) down inside of it.  If it gets damp, use a hair dryer on the cool setting to dry it.  If it gets soaked, call the office to schedule an appointment for a cast change.     After your dressing, cast or splint is removed; you may shower, but do not soak or scrub the wound.  Allow the water to run over it, and then gently pat it dry.  Swelling It is normal for you to have swelling where you had surgery.  To reduce swelling and pain, keep your toes above your nose for at least 3 days after surgery.  It may be necessary to keep your foot or leg elevated for several weeks.  If it hurts, it should be elevated.  Follow Up Call my office at [phone] when you are discharged from the hospital or surgery center to schedule an appointment to be seen two weeks after surgery.  Call my office at [phone] if you develop a fever >101.5 F, nausea, vomiting, bleeding from the surgical site or severe pain.     Post Anesthesia Home Care  Instructions  Activity: Get plenty of rest for the remainder of the day. A responsible adult should stay with you for 24 hours following the procedure.  For the next 24 hours, DO NOT: -Drive a car -Paediatric nurse -Drink alcoholic beverages -Take any medication unless instructed by your physician -Make any legal decisions or sign important papers.  Meals: Start with liquid foods such as gelatin or soup. Progress to regular foods as tolerated. Avoid greasy, spicy, heavy foods. If nausea and/or vomiting occur, drink only clear liquids until the nausea and/or vomiting subsides. Call your physician if vomiting continues.  Special Instructions/Symptoms: Your throat may feel dry or sore from the anesthesia or the breathing tube placed in your throat during surgery. If this causes discomfort, gargle with warm salt water. The discomfort should disappear within 24 hours.  If you had a scopolamine patch placed behind your ear for the management of post- operative nausea and/or vomiting:  1. The medication in the patch is effective for 72 hours, after which it should be removed.  Wrap patch in a tissue and discard in the trash. Wash hands thoroughly with soap and water. 2. You may remove the patch earlier than 72 hours if you experience unpleasant side effects which may include dry mouth, dizziness or visual disturbances. 3. Avoid touching the patch. Wash your hands with soap and water after contact with the patch.

## 2015-05-26 ENCOUNTER — Encounter (HOSPITAL_BASED_OUTPATIENT_CLINIC_OR_DEPARTMENT_OTHER): Payer: Self-pay | Admitting: Orthopedic Surgery

## 2016-05-07 DIAGNOSIS — Z01411 Encounter for gynecological examination (general) (routine) with abnormal findings: Secondary | ICD-10-CM | POA: Diagnosis not present

## 2016-05-07 DIAGNOSIS — Z124 Encounter for screening for malignant neoplasm of cervix: Secondary | ICD-10-CM | POA: Diagnosis not present

## 2016-05-07 DIAGNOSIS — Z6823 Body mass index (BMI) 23.0-23.9, adult: Secondary | ICD-10-CM | POA: Diagnosis not present

## 2016-05-09 ENCOUNTER — Other Ambulatory Visit: Payer: Self-pay | Admitting: Internal Medicine

## 2016-05-16 NOTE — Telephone Encounter (Signed)
Pt is transferring care from Dr. Shawna Orleans to Dr. Maudie Mercury.Pt is requesting thyroid medication and has an appointment set up for 05/20/2016. Pt has also had her thyroid checked last year. Okay to refill? Please advise.

## 2016-05-20 NOTE — Progress Notes (Signed)
HPI:  Teresa Bridges is here to establish care. Used to see Dr. Shawna Orleans. Last PCP and physical:  Hypothyroidism: -dx remotely -no hx of nodule/cancer/ablation or surgery -has been pretty stable on Armour thyroid for a long time -she is not sure why she is on Armour thyroid, but may be because she likes natural treatments - saw -has had some spells of feeling like had too much caffeine or on edge -she does not thinks she tried levothyroxine in the past as saw integrative doc initially -no fevers, malaise, palpitations -has had panic attacks before  Hot flashes/Vaginal dryness: -sees gyn for this and takes low dose Estradial prescribed by gyn -has urine odor today  -wants to check, FDLMP October, perimenopausal  HLD: -mild elevation in the past -regular exercise and healthy diet  Hx of mildly elevated BP: -she had pre-e -strong FH hypertension -no CP, SOB, DOE  Wants to check urine. Noticed bad odor this morning to urine. No frequency, urgency, dysuria, fevers, malaise, nausea, flank pain, hematuria, abd or pelvic pain or vaginal odor or discharge.  ROS negative for unless reported above: fevers, unintentional weight loss, hearing or vision loss, chest pain, palpitations, struggling to breath, hemoptysis, melena, hematochezia, hematuria, falls, loc, si, thoughts of self harm  Past Medical History:  Diagnosis Date  . Allergy   . Endometriosis 1998  . Headache   . Hyperlipidemia   . Hypothyroidism   . In vitro fertilization   . Nephrolithiasis   . Panic attack   . PIH (pregnancy induced hypertension)   . Pre-eclampsia     Past Surgical History:  Procedure Laterality Date  . BREAST CYST EXCISION  1998  . BUNIONECTOMY Left 05/25/2015   Procedure: MODIFIED MCBRIDE BUNIONECTOMY;  Surgeon: Wylene Simmer, MD;  Location: [place];  Service: Orthopedics;  Laterality: Left;  . EXCISION MASS LOWER EXTREMETIES Left 05/25/2015   Procedure: EXCISION MASS LEFT LATERAL  FOOT;  Surgeon: Wylene Simmer, MD;  Location: [place];  Service: Orthopedics;  Laterality: Left;  . LAPAROSCOPY  1998  . METATARSAL OSTEOTOMY Left 05/25/2015   Procedure: LEFT FIRST MT SCARF OSTEOTOMY;  Surgeon: Wylene Simmer, MD;  Location: [place];  Service: Orthopedics;  Laterality: Left;  . upper jaw surgery      Family History  Problem Relation Age of Onset  . Arthritis Mother   . Heart disease Father   . Hyperlipidemia Father   . Hypertension Father   . Cancer Maternal Grandmother     breast  . Heart disease Maternal Grandfather   . Alcohol abuse Paternal Grandmother     Social History   Social History  . Marital status: Married    Spouse name: N/A  . Number of children: 1  . Years of education: N/A   Social History Main Topics  . Smoking status: Never Smoker  . Smokeless tobacco: Never Used  . Alcohol use Yes     Comment: socially, 2-3 times per week 1-2 drinks  . Drug use: No  . Sexual activity: Yes    Partners: Male    Birth control/ protection: None   Other Topics Concern  . 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  ARMOUR THYROID 15 MG tablet, TAKE ONE AND ONE-HALF TABLETS DAILY (NEED TO CALL THE OFFICE FOR AN APPOINTMENT), Disp: 135 tablet, Rfl: 0 .  Estradiol (VAGIFEM) 10 MCG TABS vaginal tablet, Place 1 tablet (10 mcg total) vaginally 2 (two) times a week., Disp: 24 tablet, Rfl: 3  EXAM:  Vitals:   05/21/16 0919  BP: 120/90  Pulse: 83  Temp: 98.2 F (36.8 C)    Body mass index is 20.89 kg/m.  GENERAL: vitals reviewed and listed above, alert, oriented, appears well hydrated and in no acute distress  HEENT: atraumatic, conjunttiva clear, no obvious  abnormalities on inspection of external nose and ears  NECK: no obvious masses on inspection  LUNGS: clear to auscultation bilaterally, no wheezes, rales or rhonchi, good air movement  CV: HRRR, no peripheral edema  MS: moves all extremities without noticeable abnormality  PSYCH: pleasant and cooperative, no obvious depression or anxiety  ASSESSMENT AND PLAN:  Discussed the following assessment and plan:  Hypothyroidism, unspecified type - Plan: TSH, T4, Free, T3, Free -check labs -discuss potential risks/benefits various treatments and may consider change to levothyroxine  Hyperlipidemia, unspecified hyperlipidemia type - Plan: Lipid panel -labs today -lifestyle recs  Elevated blood pressure reading without diagnosis of hypertension -perhaps up related to travel/stress/restaurant food the last few days -opted for home monitoring with close recheck at [facility] in 1 month -discussed tx options and would start medication if persists  Abnormal LFTs - Plan: CMP with eGFR -hx of, had eval with PCP -does drink small amount of alcohol on regular basis  Bad odor of urine -urine dip  Establish care: -We reviewed the PMH, PSH, FH, SH, Meds and Allergies. -We provided refills for any medications we will prescribe as needed. -We addressed current concerns per orders and patient instructions. -We have asked for records for pertinent exams, studies, vaccines and notes from previous providers. -We have advised patient to follow up per instructions below.   -Patient advised to return or notify a doctor immediately if symptoms worsen or persist or new concerns arise.  Patient Instructions  BEFORE YOU LEAVE: -urine dip -labs -follow up: physical in 1 month  Please check blood pressure a few times per week and bring a log to your next visit.  We have ordered labs or studies at this visit. It can take up to 1-2 weeks for results and processing. IF results require follow up or  explanation, we will call you with instructions. Clinically stable results will be released to your [facility]. If you have not heard from [place] or cannot find your results in [facility] in 2 weeks please contact our office at [phone].  If you are not yet signed up for [facility], please consider signing up.   We recommend the following healthy lifestyle for LIFE:  Eat a healthy clean diet.  * Tip: Avoid (less then 1 serving per week): processed foods, sweets, sweetened drinks, white starches (rice, flour, bread, potatoes, pasta, etc), red meat, fast foods, butter  *Tip: CHOOSE instead   * 5-9 servings per day of fresh or frozen fruits and vegetables (but not corn, potatoes, bananas, canned or dried fruit)   *nuts and seeds, beans   *olives and olive oil   *small portions of lean meats such as fish and white chicken    *small portions of whole grains  Get at least 150 minutes of sweaty aerobic exercise per week.  Reduce stress - consider counseling, meditation and relaxation to  balance other aspects of your life.             Colin Benton R.

## 2016-05-21 ENCOUNTER — Ambulatory Visit (INDEPENDENT_AMBULATORY_CARE_PROVIDER_SITE_OTHER): Payer: Commercial Managed Care - PPO | Admitting: Family Medicine

## 2016-05-21 ENCOUNTER — Encounter: Payer: Self-pay | Admitting: Family Medicine

## 2016-05-21 VITALS — BP 120/90 | HR 83 | Temp 98.2°F | Ht 62.75 in | Wt 117.0 lb

## 2016-05-21 DIAGNOSIS — E039 Hypothyroidism, unspecified: Secondary | ICD-10-CM

## 2016-05-21 DIAGNOSIS — R8290 Unspecified abnormal findings in urine: Secondary | ICD-10-CM | POA: Diagnosis not present

## 2016-05-21 DIAGNOSIS — R945 Abnormal results of liver function studies: Secondary | ICD-10-CM

## 2016-05-21 DIAGNOSIS — R7989 Other specified abnormal findings of blood chemistry: Secondary | ICD-10-CM

## 2016-05-21 DIAGNOSIS — E785 Hyperlipidemia, unspecified: Secondary | ICD-10-CM | POA: Diagnosis not present

## 2016-05-21 DIAGNOSIS — R030 Elevated blood-pressure reading, without diagnosis of hypertension: Secondary | ICD-10-CM | POA: Diagnosis not present

## 2016-05-21 LAB — LIPID PANEL
CHOL/HDL RATIO: 3
Cholesterol: 229 mg/dL — ABNORMAL HIGH (ref 0–200)
HDL: 81.7 mg/dL (ref >39.00)
LDL CALC: 134 mg/dL — AB (ref 0–99)
NONHDL: 147.74
Triglycerides: 67 mg/dL (ref 0.0–149.0)
VLDL: 13.4 mg/dL (ref 0.0–40.0)

## 2016-05-21 LAB — POCT URINALYSIS DIPSTICK
BILIRUBIN UA: NEGATIVE
Glucose, UA: NEGATIVE
KETONES UA: NEGATIVE
LEUKOCYTES UA: NEGATIVE
Nitrite, UA: NEGATIVE
PH UA: 7
Protein, UA: NEGATIVE
RBC UA: NEGATIVE
Spec Grav, UA: 1.01
Urobilinogen, UA: 0.2

## 2016-05-21 LAB — T3, FREE: T3, Free: 3.8 pg/mL (ref 2.3–4.2)

## 2016-05-21 LAB — TSH: TSH: 1.91 u[IU]/mL (ref 0.35–4.50)

## 2016-05-21 LAB — T4, FREE: Free T4: 0.62 ng/dL (ref 0.60–1.60)

## 2016-05-21 NOTE — Patient Instructions (Addendum)
BEFORE YOU LEAVE: -urine dip -labs -follow up: physical in 1 month  Please check blood pressure a few times per week and bring a log to your next visit.  We have ordered labs or studies at this visit. It can take up to 1-2 weeks for results and processing. IF results require follow up or explanation, we will call you with instructions. Clinically stable results will be released to your [facility]. If you have not heard from [place] or cannot find your results in [facility] in 2 weeks please contact our office at [phone].  If you are not yet signed up for [facility], please consider signing up.   We recommend the following healthy lifestyle for LIFE:  Eat a healthy clean diet.  * Tip: Avoid (less then 1 serving per week): processed foods, sweets, sweetened drinks, white starches (rice, flour, bread, potatoes, pasta, etc), red meat, fast foods, butter  *Tip: CHOOSE instead   * 5-9 servings per day of fresh or frozen fruits and vegetables (but not corn, potatoes, bananas, canned or dried fruit)   *nuts and seeds, beans   *olives and olive oil   *small portions of lean meats such as fish and white chicken    *small portions of whole grains  Get at least 150 minutes of sweaty aerobic exercise per week.  Reduce stress - consider counseling, meditation and relaxation to balance other aspects of your life.

## 2016-05-21 NOTE — Progress Notes (Signed)
Pre visit review using our clinic review tool, if applicable. No additional management support is needed unless otherwise documented below in the visit note. 

## 2016-05-22 LAB — COMPLETE METABOLIC PANEL WITH GFR
ALBUMIN: 4.3 g/dL (ref 3.6–5.1)
ALK PHOS: 55 U/L (ref 33–130)
ALT: 22 U/L (ref 6–29)
AST: 41 U/L — AB (ref 10–35)
BILIRUBIN TOTAL: 0.6 mg/dL (ref 0.2–1.2)
BUN: 14 mg/dL (ref 7–25)
CALCIUM: 9.6 mg/dL (ref 8.6–10.4)
CO2: 29 mmol/L (ref 20–31)
CREATININE: 0.78 mg/dL (ref 0.50–1.05)
Chloride: 104 mmol/L (ref 98–110)
GFR, Est African American: >89 mL/min (ref >=60)
GFR, Est Non African American: 89 mL/min (ref >=60)
GLUCOSE: 99 mg/dL (ref 65–99)
POTASSIUM: 4.4 mmol/L (ref 3.5–5.3)
SODIUM: 139 mmol/L (ref 135–146)
TOTAL PROTEIN: 6.5 g/dL (ref 6.1–8.1)

## 2016-05-28 ENCOUNTER — Telehealth: Payer: Self-pay | Admitting: Family Medicine

## 2016-05-28 DIAGNOSIS — R450 Nervousness: Secondary | ICD-10-CM | POA: Diagnosis not present

## 2016-05-28 NOTE — Telephone Encounter (Signed)
See results note. 

## 2016-05-28 NOTE — Addendum Note (Signed)
Addended by: Agnes Lawrence on: 05/28/2016 08:35 AM   Modules accepted: Orders

## 2016-05-28 NOTE — Telephone Encounter (Signed)
Pt returning your call

## 2016-06-06 DIAGNOSIS — R450 Nervousness: Secondary | ICD-10-CM | POA: Diagnosis not present

## 2016-06-13 DIAGNOSIS — R450 Nervousness: Secondary | ICD-10-CM | POA: Diagnosis not present

## 2016-06-18 DIAGNOSIS — R450 Nervousness: Secondary | ICD-10-CM | POA: Diagnosis not present

## 2016-06-19 NOTE — Progress Notes (Signed)
HPI:  Teresa Bridges is a pleasant 51 y.o. here for her annual exam. Chronic medical problems summarized below were reviewed for changes and stability and were updated as needed below. These issues and their treatment remain stable for the most part. Just had labs. Mildly abnormal lipids and LFTs - has scheduled recheck. Refraining for alcohol and tylenol and doing a healthy diet advised. Gets regular exercise. Had been drinking several days per week 1-2 drinks. Takes a lot of supplements. No abd pain, malaise, bleeding. BP at home 1 teens -130s/70-80s. See dermatologist yearly for skin check. Sees gyn for mammo and women's health. Sees GI and has appt for colonosocpy.  Hypothyroidism: -dx remotely -no hx of nodule/cancer/ablation or surgery -has been pretty stable on Armour thyroid for a long time -she is not sure why she is on Armour thyroid, but may be because she likes natural treatments  - in the past saw integrative doc  -no fevers, malaise, palpitations  Hot flashes/Vaginal dryness: -sees gyn for this and takes low dose Estradial prescribed by gyn  HLD: -mild elevation in the past -regular exercise and healthy diet  Hx of mildly elevated BP: -she had pre-e -strong FH hypertension -no CP, SOB, DOE  -Concerns and/or follow up today: none  -Taking folic acid, vitamin D or calcium: takes vit D  -Vaccines: UTD  -pap history: done with gy  -sexual activity: yes, female partner, no new partners  -wants STI testing (Hep C if born [birth year]): no  -FH breast, colon or ovarian ca: see FH Last mammogram: does with gyn Last colon cancer screening: normal in 2009 per chart notes  -Alcohol, Tobacco, drug use: see social history  Review of Systems - no fevers, unintentional weight loss, vision loss, hearing loss, chest pain, sob, hemoptysis, melena, hematochezia, hematuria, genital discharge, changing or concerning skin lesions, bleeding, bruising, loc, thoughts of self harm or  SI  Past Medical History:  Diagnosis Date  . Allergy   . Endometriosis 1998  . Headache   . Hyperlipidemia   . Hypothyroidism   . In vitro fertilization   . Nephrolithiasis   . Panic attack   . PIH (pregnancy induced hypertension)   . Pre-eclampsia     Past Surgical History:  Procedure Laterality Date  . BREAST CYST EXCISION  1998  . BUNIONECTOMY Left 05/25/2015   Procedure: MODIFIED MCBRIDE BUNIONECTOMY;  Surgeon: Wylene Simmer, MD;  Location: [place];  Service: Orthopedics;  Laterality: Left;  . EXCISION MASS LOWER EXTREMETIES Left 05/25/2015   Procedure: EXCISION MASS LEFT LATERAL FOOT;  Surgeon: Wylene Simmer, MD;  Location: [place];  Service: Orthopedics;  Laterality: Left;  . LAPAROSCOPY  1998  . METATARSAL OSTEOTOMY Left 05/25/2015   Procedure: LEFT FIRST MT SCARF OSTEOTOMY;  Surgeon: Wylene Simmer, MD;  Location: [place];  Service: Orthopedics;  Laterality: Left;  . upper jaw surgery      Family History  Problem Relation Age of Onset  . Arthritis Mother   . Heart disease Father   . Hyperlipidemia Father   . Hypertension Father   . Cancer Maternal Grandmother     breast  . Heart disease Maternal Grandfather   . Alcohol abuse Paternal Grandmother     Social History   Social History  . Marital status: Married    Spouse name: N/A  . Number of children: 1  . Years of education: N/A   Social History Main Topics  . Smoking status: Never Smoker  .  Smokeless tobacco: Never Used  . Alcohol use Yes     Comment: socially, 2-3 times per week 1-2 drinks  . Drug use: No  . Sexual activity: Yes    Partners: Male    Birth control/ protection: None   Other Topics Concern  . 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  ARMOUR THYROID 15 MG tablet, TAKE ONE AND ONE-HALF TABLETS DAILY (NEED TO CALL THE OFFICE FOR AN APPOINTMENT), Disp: 135 tablet, Rfl: 0 .  Estradiol (VAGIFEM) 10 MCG TABS vaginal tablet, Place 1 tablet (10 mcg total) vaginally 2 (two) times a week., Disp: 24 tablet, Rfl: 3  EXAM:  Vitals:   06/20/16 0940  BP: 100/80  Pulse: 90  Temp: 98.3 F (36.8 C)    GENERAL: vitals reviewed and listed below, alert, oriented, appears well hydrated and in no acute distress  HEENT: head atraumatic, PERRLA, normal appearance of eyes, ears, nose and mouth. moist mucus membranes.  NECK: supple, no masses or lymphadenopathy  LUNGS: clear to auscultation bilaterally, no rales, rhonchi or wheeze  CV: HRRR, no peripheral edema or cyanosis, normal pedal pulses  ABDOMEN: bowel sounds normal, soft, non tender to palpation, no masses, no rebound or guarding  SKIN: no rash or abnormal lesions - fulls skin check declined - does with gyn  GU/BREAST: declined - does with gyn  MS: normal gait, moves all extremities normally  NEURO: normal gait, speech and thought processing grossly intact, muscle tone grossly intact throughout  PSYCH: normal affect, pleasant and cooperative  ASSESSMENT AND PLAN:  Discussed the following assessment and plan:  Encounter for preventive health examination  -Discussed and advised all [place] preventive services health task force level A and B recommendations for age, sex and risks.  -Advised at least 150 minutes of exercise per week and a healthy diet with avoidance of (less then 1 serving per week) processed foods, white starches, red meat, fast foods and sweets and consisting of: * 5-9 servings of fresh fruits and vegetables (not corn or potatoes) *nuts and seeds, beans *olives and olive oil *lean meats such as fish and white chicken  *whole grains  -labs, studies and vaccines per orders this  encounter  No orders of the defined types were placed in this encounter.   Patient advised to return to clinic immediately if symptoms worsen or persist or new concerns.  Patient Instructions  BEFORE YOU LEAVE: -follow up: 4- 6 months with Dr. Maudie Mercury -lab appt in 2 months  Vit D3 [phone] IU daily ([employer] brand)  Hold other supplements until liver recheck.   We recommend the following healthy lifestyle for LIFE: The Mediterranean diet is a great diet to choose. 1) Small portions.   Tip: eat off of a salad plate instead of a dinner plate.  Tip: if you need more or a snack choose fruits, veggies and/or a handful of nuts or seeds.  2) Eat a healthy clean diet.  * Tip: Avoid (less then 1 serving per week): processed foods, sweets, sweetened drinks, white starches (rice, flour, bread, potatoes, pasta, etc), red meat, fast foods, butter  *Tip: CHOOSE instead   * 5-9 servings per day  of fresh or frozen fruits and vegetables (but not corn, potatoes, bananas, canned or dried fruit)   *nuts and seeds, beans   *olives and olive oil   *small portions of lean meats such as fish and white chicken    *small portions of whole grains  3)Get at least 150 minutes of sweaty aerobic exercise per week.  4)Reduce stress - consider counseling, meditation and relaxation to balance other aspects of your life.     No Follow-up on file.  Colin Benton R., DO

## 2016-06-20 ENCOUNTER — Encounter: Payer: Self-pay | Admitting: Family Medicine

## 2016-06-20 ENCOUNTER — Ambulatory Visit (INDEPENDENT_AMBULATORY_CARE_PROVIDER_SITE_OTHER): Payer: Commercial Managed Care - PPO | Admitting: Family Medicine

## 2016-06-20 VITALS — BP 100/80 | HR 90 | Temp 98.3°F | Ht 63.0 in | Wt 114.0 lb

## 2016-06-20 DIAGNOSIS — Z808 Family history of malignant neoplasm of other organs or systems: Secondary | ICD-10-CM | POA: Insufficient documentation

## 2016-06-20 DIAGNOSIS — C4491 Basal cell carcinoma of skin, unspecified: Secondary | ICD-10-CM

## 2016-06-20 DIAGNOSIS — Z0000 Encounter for general adult medical examination without abnormal findings: Secondary | ICD-10-CM | POA: Diagnosis not present

## 2016-06-20 HISTORY — DX: Basal cell carcinoma of skin, unspecified: C44.91

## 2016-06-20 NOTE — Progress Notes (Signed)
Pre visit review using our clinic review tool, if applicable. No additional management support is needed unless otherwise documented below in the visit note. 

## 2016-06-20 NOTE — Patient Instructions (Addendum)
BEFORE YOU LEAVE: -follow up: 4- 6 months with Dr. Maudie Mercury -lab appt in 2 months  Vit D3 [phone] IU daily ([employer] brand)  Hold other supplements until liver recheck.   We recommend the following healthy lifestyle for LIFE: The Mediterranean diet is a great diet to choose. 1) Small portions.   Tip: eat off of a salad plate instead of a dinner plate.  Tip: if you need more or a snack choose fruits, veggies and/or a handful of nuts or seeds.  2) Eat a healthy clean diet.  * Tip: Avoid (less then 1 serving per week): processed foods, sweets, sweetened drinks, white starches (rice, flour, bread, potatoes, pasta, etc), red meat, fast foods, butter  *Tip: CHOOSE instead   * 5-9 servings per day of fresh or frozen fruits and vegetables (but not corn, potatoes, bananas, canned or dried fruit)   *nuts and seeds, beans   *olives and olive oil   *small portions of lean meats such as fish and white chicken    *small portions of whole grains  3)Get at least 150 minutes of sweaty aerobic exercise per week.  4)Reduce stress - consider counseling, meditation and relaxation to balance other aspects of your life.

## 2016-06-25 DIAGNOSIS — R450 Nervousness: Secondary | ICD-10-CM | POA: Diagnosis not present

## 2016-07-02 DIAGNOSIS — R450 Nervousness: Secondary | ICD-10-CM | POA: Diagnosis not present

## 2016-07-09 DIAGNOSIS — Z1211 Encounter for screening for malignant neoplasm of colon: Secondary | ICD-10-CM | POA: Diagnosis not present

## 2016-07-09 DIAGNOSIS — K625 Hemorrhage of anus and rectum: Secondary | ICD-10-CM | POA: Diagnosis not present

## 2016-07-09 DIAGNOSIS — K648 Other hemorrhoids: Secondary | ICD-10-CM | POA: Diagnosis not present

## 2016-07-10 ENCOUNTER — Other Ambulatory Visit: Payer: Self-pay | Admitting: Gastroenterology

## 2016-07-10 DIAGNOSIS — R1310 Dysphagia, unspecified: Secondary | ICD-10-CM

## 2016-07-19 ENCOUNTER — Ambulatory Visit
Admission: RE | Admit: 2016-07-19 | Discharge: 2016-07-19 | Disposition: A | Discharge status: Home / Self Care | Payer: Commercial Managed Care - PPO | Source: Ambulatory Visit | Attending: Gastroenterology | Admitting: Gastroenterology

## 2016-07-19 DIAGNOSIS — R1013 Epigastric pain: Secondary | ICD-10-CM | POA: Diagnosis not present

## 2016-07-19 DIAGNOSIS — R1310 Dysphagia, unspecified: Secondary | ICD-10-CM

## 2016-08-14 ENCOUNTER — Other Ambulatory Visit: Payer: Self-pay | Admitting: Family Medicine

## 2016-08-26 ENCOUNTER — Other Ambulatory Visit (INDEPENDENT_AMBULATORY_CARE_PROVIDER_SITE_OTHER): Payer: Commercial Managed Care - PPO

## 2016-08-26 DIAGNOSIS — R945 Abnormal results of liver function studies: Secondary | ICD-10-CM | POA: Diagnosis not present

## 2016-08-26 DIAGNOSIS — R7989 Other specified abnormal findings of blood chemistry: Secondary | ICD-10-CM

## 2016-08-26 DIAGNOSIS — E785 Hyperlipidemia, unspecified: Secondary | ICD-10-CM | POA: Diagnosis not present

## 2016-08-26 LAB — HEPATIC FUNCTION PANEL
ALBUMIN: 4.2 g/dL (ref 3.5–5.2)
ALT: 19 U/L (ref 0–35)
AST: 37 U/L (ref 0–37)
Alkaline Phosphatase: 57 U/L (ref 39–117)
BILIRUBIN DIRECT: 0.1 mg/dL (ref 0.0–0.3)
TOTAL PROTEIN: 6.2 g/dL (ref 6.0–8.3)
Total Bilirubin: 0.7 mg/dL (ref 0.2–1.2)

## 2016-08-26 LAB — LIPID PANEL
CHOL/HDL RATIO: 3
Cholesterol: 209 mg/dL — ABNORMAL HIGH (ref 0–200)
HDL: 72.2 mg/dL (ref >39.00)
LDL CALC: 127 mg/dL — AB (ref 0–99)
NonHDL: 137.04
TRIGLYCERIDES: 51 mg/dL (ref 0.0–149.0)
VLDL: 10.2 mg/dL (ref 0.0–40.0)

## 2016-08-29 NOTE — Progress Notes (Signed)
HPI:  Teresa Bridges is a pleasant 51 y.o. here for follow up. Chronic medical problems summarized below were reviewed for changes and stability and were updated as needed below. These issues and their treatment remain stable for the most part. She quit drinking alcohol except for a drink or two per week and liver enzymes were normal on recent check. Cholesterol mildly elevated, but ratio good. She is eating healthy and getting regular exercise. Small scaly skin lesion R frontal region at scalp line for about 1 month. Sees dermatologist for regular skin exams and hx ak and bcc, but wanted to see if I could freeze this. Denies CP, SOB, DOE, treatment intolerance or new symptoms.  Hypothyroidism: -dx remotely -no hx of nodule/cancer/ablation or surgery -has been pretty stable on Armour thyroid for a long time -she is not sure why she is on Armour thyroid, but may be because she likes natural treatments  -no fevers, malaise, palpitations  Hot flashes/Vaginal dryness: -sees gyn for this and takes low dose Estradial prescribed by gyn -has urine odor today  -wants to check, FDLMP October, perimenopausal  HLD: -mild elevation in the past -regular exercise and healthy diet  Hx of mildly elevated BP: -she had pre-e -strong FH hypertension -no CP, SOB, DOE   ROS: See pertinent positives and negatives per HPI.  Past Medical History:  Diagnosis Date  . Allergy   . BCC (basal cell carcinoma), history of, sees dermatologist 06/20/2016  . Endometriosis 1998  . Headache   . Hyperlipidemia   . Hypothyroidism   . In vitro fertilization   . Nephrolithiasis   . Panic attack   . PIH (pregnancy induced hypertension)   . Pre-eclampsia     Past Surgical History:  Procedure Laterality Date  . BREAST CYST EXCISION  1998  . BUNIONECTOMY Left 05/25/2015   Procedure: MODIFIED MCBRIDE BUNIONECTOMY;  Surgeon: Wylene Simmer, MD;  Location: [place];  Service: Orthopedics;  Laterality:  Left;  . EXCISION MASS LOWER EXTREMETIES Left 05/25/2015   Procedure: EXCISION MASS LEFT LATERAL FOOT;  Surgeon: Wylene Simmer, MD;  Location: [place];  Service: Orthopedics;  Laterality: Left;  . LAPAROSCOPY  1998  . METATARSAL OSTEOTOMY Left 05/25/2015   Procedure: LEFT FIRST MT SCARF OSTEOTOMY;  Surgeon: Wylene Simmer, MD;  Location: [place];  Service: Orthopedics;  Laterality: Left;  . upper jaw surgery      Family History  Problem Relation Age of Onset  . Arthritis Mother   . Heart disease Father   . Hyperlipidemia Father   . Hypertension Father   . Cancer Maternal Grandmother     breast  . Heart disease Maternal Grandfather   . Alcohol abuse Paternal Grandmother     Social History   Social History  . Marital status: Married    Spouse name: N/A  . Number of children: 1  . Years of education: N/A   Social History Main Topics  . Smoking status: Never Smoker  . Smokeless tobacco: Never Used  . Alcohol use Yes     Comment: socially, 2-3 times per week 1-2 drinks  . Drug use: No  . Sexual activity: Yes    Partners: Male    Birth control/ protection: None   Other Topics Concern  . 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  ARMOUR THYROID 15 MG tablet, TAKE ONE AND ONE-HALF TABLETS DAILY (NEED TO CALL OFFICE FOR AN APPOINTMENT), Disp: 135 tablet, Rfl: 0 .  Estradiol (VAGIFEM) 10 MCG TABS vaginal tablet, Place 1 tablet (10 mcg total) vaginally 2 (two) times a week., Disp: 24 tablet, Rfl: 3  EXAM:  Vitals:   08/30/16 1025  BP: 100/80  Pulse: 81  Temp: 98.3 F (36.8 C)    Body mass index is 20.37 kg/m.  GENERAL: vitals reviewed and listed above, alert, oriented,  appears well hydrated and in no acute distress  HEENT: atraumatic, conjunttiva clear, no obvious abnormalities on inspection of external nose and ears  NECK: no obvious masses on inspection  LUNGS: clear to auscultation bilaterally, no wheezes, rales or rhonchi, good air movement  CV: HRRR, no peripheral edema  SKIN: small 60mm area of scaly skin R frontal at scalp line  MS: moves all extremities without noticeable abnormality  PSYCH: pleasant and cooperative, no obvious depression or anxiety  ASSESSMENT AND PLAN:  Discussed the following assessment and plan:  Skin lesion -we discussed possible serious and likely etiologies, workup and treatment, treatment risks and return precautions; query AK -after this discussion, Teresa Bridges opted for cryosurgery and close follow up, freeze thaw x3, tolerated well -follow up advised with dermatologist or here in 1 month, continue skin exams with derm regardless -of course, we advised Teresa Bridges  to return or notify a doctor immediately if symptoms worsen or persist or new concerns arise.  Hypothyroidism, unspecified type -stable  Hyperlipidemia, unspecified hyperlipidemia type -discussed lab results -lifestyle recs  Elevated liver enzymes -resolved -advised limiting alcohol and tylenol  -Patient advised to return or notify a doctor immediately if symptoms worsen or persist or new concerns arise.  Declined AVS.  There are no Patient Instructions on file for this visit.  Colin Benton R., DO

## 2016-08-30 ENCOUNTER — Encounter: Payer: Self-pay | Admitting: Family Medicine

## 2016-08-30 ENCOUNTER — Ambulatory Visit (INDEPENDENT_AMBULATORY_CARE_PROVIDER_SITE_OTHER): Payer: Commercial Managed Care - PPO | Admitting: Family Medicine

## 2016-08-30 VITALS — BP 100/80 | HR 81 | Temp 98.3°F | Ht 63.0 in | Wt 115.0 lb

## 2016-08-30 DIAGNOSIS — R748 Abnormal levels of other serum enzymes: Secondary | ICD-10-CM | POA: Diagnosis not present

## 2016-08-30 DIAGNOSIS — L989 Disorder of the skin and subcutaneous tissue, unspecified: Secondary | ICD-10-CM

## 2016-08-30 DIAGNOSIS — E785 Hyperlipidemia, unspecified: Secondary | ICD-10-CM

## 2016-08-30 DIAGNOSIS — E039 Hypothyroidism, unspecified: Secondary | ICD-10-CM | POA: Diagnosis not present

## 2016-08-30 NOTE — Progress Notes (Signed)
Pre visit review using our clinic review tool, if applicable. No additional management support is needed unless otherwise documented below in the visit note. 

## 2016-09-12 DIAGNOSIS — H31001 Unspecified chorioretinal scars, right eye: Secondary | ICD-10-CM | POA: Diagnosis not present

## 2016-09-12 DIAGNOSIS — H524 Presbyopia: Secondary | ICD-10-CM | POA: Diagnosis not present

## 2016-09-12 DIAGNOSIS — H5213 Myopia, bilateral: Secondary | ICD-10-CM | POA: Diagnosis not present

## 2016-09-27 ENCOUNTER — Ambulatory Visit: Payer: Commercial Managed Care - PPO | Admitting: Family Medicine

## 2016-10-16 DIAGNOSIS — D2239 Melanocytic nevi of other parts of face: Secondary | ICD-10-CM | POA: Diagnosis not present

## 2016-10-16 DIAGNOSIS — D2262 Melanocytic nevi of left upper limb, including shoulder: Secondary | ICD-10-CM | POA: Diagnosis not present

## 2016-10-16 DIAGNOSIS — Z85828 Personal history of other malignant neoplasm of skin: Secondary | ICD-10-CM | POA: Diagnosis not present

## 2016-11-12 ENCOUNTER — Other Ambulatory Visit: Payer: Self-pay | Admitting: Family Medicine

## 2017-01-23 ENCOUNTER — Encounter: Payer: Self-pay | Admitting: Family Medicine

## 2017-02-10 ENCOUNTER — Other Ambulatory Visit: Payer: Self-pay | Admitting: Family Medicine

## 2017-03-19 DIAGNOSIS — Z1211 Encounter for screening for malignant neoplasm of colon: Secondary | ICD-10-CM | POA: Diagnosis not present

## 2017-03-19 DIAGNOSIS — K635 Polyp of colon: Secondary | ICD-10-CM | POA: Diagnosis not present

## 2017-03-19 DIAGNOSIS — D122 Benign neoplasm of ascending colon: Secondary | ICD-10-CM | POA: Diagnosis not present

## 2017-03-19 DIAGNOSIS — D120 Benign neoplasm of cecum: Secondary | ICD-10-CM | POA: Diagnosis not present

## 2017-04-10 DIAGNOSIS — Z1231 Encounter for screening mammogram for malignant neoplasm of breast: Secondary | ICD-10-CM | POA: Diagnosis not present

## 2017-05-13 DIAGNOSIS — R350 Frequency of micturition: Secondary | ICD-10-CM | POA: Diagnosis not present

## 2017-05-13 DIAGNOSIS — Z124 Encounter for screening for malignant neoplasm of cervix: Secondary | ICD-10-CM | POA: Diagnosis not present

## 2017-05-13 DIAGNOSIS — Z01411 Encounter for gynecological examination (general) (routine) with abnormal findings: Secondary | ICD-10-CM | POA: Diagnosis not present

## 2017-05-15 ENCOUNTER — Encounter: Payer: Self-pay | Admitting: Family Medicine

## 2017-08-05 ENCOUNTER — Encounter: Payer: Self-pay | Admitting: Family Medicine

## 2017-08-05 ENCOUNTER — Ambulatory Visit: Payer: Commercial Managed Care - PPO | Admitting: Family Medicine

## 2017-08-05 VITALS — BP 122/80 | HR 82 | Temp 98.4°F | Ht 63.0 in | Wt 118.8 lb

## 2017-08-05 DIAGNOSIS — E039 Hypothyroidism, unspecified: Secondary | ICD-10-CM

## 2017-08-05 NOTE — Patient Instructions (Addendum)
BEFORE YOU LEAVE: -lab -follow up: Physical in 4- 6 months, please come fasting so we can check your cholesterol  We have ordered labs or studies at this visit. It can take up to 1-2 weeks for results and processing. IF results require follow up or explanation, we will call you with instructions. Clinically stable results will be released to your [facility]. If you have not heard from [place] or cannot find your results in [facility] in 2 weeks please contact our office at [phone].  If you are not yet signed up for [facility], please consider signing up.

## 2017-08-05 NOTE — Progress Notes (Signed)
HPI:  Using dictation device. Unfortunately this device frequently misinterprets words/phrases.  Teresa Bridges is a pleasant 52 year old here for follow-up of hypothyroidism.  She has been on Armour Thyroid for a long time for this.  She feels well overall.  Occasionally tired in the afternoon.  Sometimes feels like her eyebrows may be thinning a little bit.  She has gained a few pounds.  She eats healthy and tries to get regular exercise.  No palpitations no significant temperature intolerances.  Occasionally sluggish bowels, but has a bowel movement daily.  She had a normal colonoscopy recently.   ROS: See pertinent positives and negatives per HPI.  Past Medical History:  Diagnosis Date  . Allergy   . BCC (basal cell carcinoma), history of, sees dermatologist 06/20/2016  . Endometriosis 1998  . Headache   . Hyperlipidemia   . Hypothyroidism   . In vitro fertilization   . Nephrolithiasis   . Panic attack   . PIH (pregnancy induced hypertension)   . Pre-eclampsia     Past Surgical History:  Procedure Laterality Date  . BREAST CYST EXCISION  1998  . BUNIONECTOMY Left 05/25/2015   Procedure: MODIFIED MCBRIDE BUNIONECTOMY;  Surgeon: Wylene Simmer, MD;  Location: [place];  Service: Orthopedics;  Laterality: Left;  . EXCISION MASS LOWER EXTREMETIES Left 05/25/2015   Procedure: EXCISION MASS LEFT LATERAL FOOT;  Surgeon: Wylene Simmer, MD;  Location: [place];  Service: Orthopedics;  Laterality: Left;  . LAPAROSCOPY  1998  . METATARSAL OSTEOTOMY Left 05/25/2015   Procedure: LEFT FIRST MT SCARF OSTEOTOMY;  Surgeon: Wylene Simmer, MD;  Location: [place];  Service: Orthopedics;  Laterality: Left;  . upper jaw surgery      Family History  Problem Relation Age of Onset  . Arthritis Mother   . Heart disease Father   . Hyperlipidemia Father   . Hypertension Father   . Cancer Maternal Grandmother        breast  . Heart disease Maternal  Grandfather   . Alcohol abuse Paternal Grandmother     SOCIAL HX:    Current Outpatient Medications:  .  ARMOUR THYROID 15 MG tablet, TAKE ONE AND ONE-HALF TABLETS DAILY (NEED TO CALL OFFICE FOR AN APPOINTMENT), Disp: 135 tablet, Rfl: 1 .  Estradiol (VAGIFEM) 10 MCG TABS vaginal tablet, Place 1 tablet (10 mcg total) vaginally 2 (two) times a week., Disp: 24 tablet, Rfl: 3  EXAM:  Vitals:   08/05/17 1612  BP: 122/80  Pulse: 82  Temp: 98.4 F (36.9 C)    Body mass index is 21.04 kg/m.  GENERAL: vitals reviewed and listed above, alert, oriented, appears well hydrated and in no acute distress  HEENT: atraumatic, conjunttiva clear, no obvious abnormalities on inspection of external nose and ears  NECK: no obvious masses on inspection  LUNGS: clear to auscultation bilaterally, no wheezes, rales or rhonchi, good air movement  CV: HRRR, no peripheral edema  MS: moves all extremities without noticeable abnormality  PSYCH: pleasant and cooperative, no obvious depression or anxiety  ASSESSMENT AND PLAN:  Discussed the following assessment and plan:  Hypothyroidism, unspecified type - Plan: TSH  -Lab check, then refill thyroid medication -Discussed various treatment options for thyroid and risks, opted to continue [place] for now, consideration of change to levothyroxine at some point -4-6 months CPE -will need fasting labs  Patient Instructions  BEFORE YOU LEAVE: -lab -follow up: Physical in 6 months, please come fasting so we can check your  cholesterol  We have ordered labs or studies at this visit. It can take up to 1-2 weeks for results and processing. IF results require follow up or explanation, we will call you with instructions. Clinically stable results will be released to your [facility]. If you have not heard from [place] or cannot find your results in [facility] in 2 weeks please contact our office at [phone].  If you are not yet signed up for [facility], please consider  signing up.           Lucretia Kern, DO

## 2017-08-06 ENCOUNTER — Encounter: Payer: Self-pay | Admitting: Family Medicine

## 2017-08-06 LAB — TSH: TSH: 1.69 u[IU]/mL (ref 0.35–4.50)

## 2017-08-09 ENCOUNTER — Other Ambulatory Visit: Payer: Self-pay | Admitting: Family Medicine

## 2017-09-09 DIAGNOSIS — M65872 Other synovitis and tenosynovitis, left ankle and foot: Secondary | ICD-10-CM | POA: Diagnosis not present

## 2017-09-09 DIAGNOSIS — M79672 Pain in left foot: Secondary | ICD-10-CM | POA: Diagnosis not present

## 2017-09-25 DIAGNOSIS — H31001 Unspecified chorioretinal scars, right eye: Secondary | ICD-10-CM | POA: Diagnosis not present

## 2017-09-25 DIAGNOSIS — H524 Presbyopia: Secondary | ICD-10-CM | POA: Diagnosis not present

## 2017-09-25 DIAGNOSIS — H5213 Myopia, bilateral: Secondary | ICD-10-CM | POA: Diagnosis not present

## 2018-01-21 DIAGNOSIS — Z85828 Personal history of other malignant neoplasm of skin: Secondary | ICD-10-CM | POA: Diagnosis not present

## 2018-01-21 DIAGNOSIS — D225 Melanocytic nevi of trunk: Secondary | ICD-10-CM | POA: Diagnosis not present

## 2018-01-21 DIAGNOSIS — D2262 Melanocytic nevi of left upper limb, including shoulder: Secondary | ICD-10-CM | POA: Diagnosis not present

## 2018-01-21 DIAGNOSIS — D2261 Melanocytic nevi of right upper limb, including shoulder: Secondary | ICD-10-CM | POA: Diagnosis not present

## 2018-01-21 DIAGNOSIS — D485 Neoplasm of uncertain behavior of skin: Secondary | ICD-10-CM | POA: Diagnosis not present

## 2018-02-07 ENCOUNTER — Other Ambulatory Visit: Payer: Self-pay | Admitting: Family Medicine

## 2018-04-06 DIAGNOSIS — N952 Postmenopausal atrophic vaginitis: Secondary | ICD-10-CM | POA: Diagnosis not present

## 2018-04-06 DIAGNOSIS — R350 Frequency of micturition: Secondary | ICD-10-CM | POA: Diagnosis not present

## 2018-04-06 DIAGNOSIS — N941 Unspecified dyspareunia: Secondary | ICD-10-CM | POA: Diagnosis not present

## 2018-04-14 DIAGNOSIS — Z1231 Encounter for screening mammogram for malignant neoplasm of breast: Secondary | ICD-10-CM | POA: Diagnosis not present

## 2018-05-11 ENCOUNTER — Other Ambulatory Visit: Payer: Self-pay | Admitting: Family Medicine

## 2018-05-19 LAB — HM PAP SMEAR

## 2018-05-21 ENCOUNTER — Ambulatory Visit: Payer: Commercial Managed Care - PPO | Admitting: Family Medicine

## 2018-05-26 ENCOUNTER — Encounter: Payer: Self-pay | Admitting: Family Medicine

## 2018-05-26 ENCOUNTER — Ambulatory Visit (INDEPENDENT_AMBULATORY_CARE_PROVIDER_SITE_OTHER): Payer: 59 | Admitting: Family Medicine

## 2018-05-26 VITALS — BP 142/90 | HR 96 | Temp 98.5°F | Ht 62.75 in | Wt 119.1 lb

## 2018-05-26 DIAGNOSIS — I10 Essential (primary) hypertension: Secondary | ICD-10-CM | POA: Diagnosis not present

## 2018-05-26 DIAGNOSIS — E039 Hypothyroidism, unspecified: Secondary | ICD-10-CM | POA: Diagnosis not present

## 2018-05-26 MED ORDER — AMLODIPINE BESYLATE 5 MG PO TABS: 5.0000 mg | ORAL_TABLET | Freq: Every day | ORAL | 3 refills | Status: DC

## 2018-05-26 NOTE — Patient Instructions (Signed)
BEFORE YOU LEAVE: -labs -follow up: 1 month hypertension  START the Norvasc (amlodipine) 5 mg and take once daily each morning for elevated blood pressure.  We have ordered labs or studies at this visit. It can take up to 1-2 weeks for results and processing. IF results require follow up or explanation, we will call you with instructions. Clinically stable results will be released to your [facility]. If you have not heard from [place] or cannot find your results in [facility] in 2 weeks please contact our office at [phone].  If you are not yet signed up for [facility], please consider signing up.  Consider counseling (if not already doing) for stress. Dennison Bulla with [facility] is great.

## 2018-05-26 NOTE — Progress Notes (Signed)
HPI:  Using dictation device. Unfortunately this device frequently misinterprets words/phrases.  Follow up:  Hypothyroidism: -on Armour thyroid chronically from integrative doc initially -ok on this, ok palpitation, fatigue stable  Hypertension: -up a little in the past and on bp med after pregnancy but hated the way it made her feel -increased stress -strong FH HTN -up at last appt with gyn -on vaginal hormone with gyn -no CP, SOB, HA, swelling -eats healthy, regular exercise -no regular alcohol or nsaids  ROS: See pertinent positives and negatives per HPI.  Past Medical History:  Diagnosis Date  . Allergy   . BCC (basal cell carcinoma), history of, sees dermatologist 06/20/2016  . Endometriosis 1998  . Headache   . Hyperlipidemia   . Hypothyroidism   . In vitro fertilization   . Nephrolithiasis   . Panic attack   . PIH (pregnancy induced hypertension)   . Pre-eclampsia     Past Surgical History:  Procedure Laterality Date  . BREAST CYST EXCISION  1998  . BUNIONECTOMY Left 05/25/2015   Procedure: MODIFIED MCBRIDE BUNIONECTOMY;  Surgeon: Wylene Simmer, MD;  Location: [place];  Service: Orthopedics;  Laterality: Left;  . EXCISION MASS LOWER EXTREMETIES Left 05/25/2015   Procedure: EXCISION MASS LEFT LATERAL FOOT;  Surgeon: Wylene Simmer, MD;  Location: [place];  Service: Orthopedics;  Laterality: Left;  . LAPAROSCOPY  1998  . METATARSAL OSTEOTOMY Left 05/25/2015   Procedure: LEFT FIRST MT SCARF OSTEOTOMY;  Surgeon: Wylene Simmer, MD;  Location: [place];  Service: Orthopedics;  Laterality: Left;  . upper jaw surgery      Family History  Problem Relation Age of Onset  . Arthritis Mother   . Heart disease Father   . Hyperlipidemia Father   . Hypertension Father   . Cancer Maternal Grandmother        breast  . Heart disease Maternal Grandfather   . Alcohol abuse Paternal Grandmother     SOCIAL HX: see   Current  Outpatient Medications:  .  INTRAROSA 6.5 MG INST, , Disp: , Rfl:  .  thyroid (ARMOUR THYROID) 15 MG tablet, TAKE ONE AND ONE-HALF TABLETS ONCE DAILY (NEED AN APPOINTMENT), Disp: 135 tablet, Rfl: 0 .  amLODipine (NORVASC) 5 MG tablet, Take 1 tablet (5 mg total) by mouth daily., Disp: 90 tablet, Rfl: 3  EXAM:  Vitals:   05/26/18 1505  BP: (!) 142/90  Pulse: 96  Temp: 98.5 F (36.9 C)    Body mass index is 21.27 kg/m.  GENERAL: vitals reviewed and listed above, alert, oriented, appears well hydrated and in no acute distress  HEENT: atraumatic, conjunttiva clear, no obvious abnormalities on inspection of external nose and ears  NECK: no obvious masses on inspection  LUNGS: clear to auscultation bilaterally, no wheezes, rales or rhonchi, good air movement  CV: HRRR, no peripheral edema  MS: moves all extremities without noticeable abnormality  PSYCH: pleasant and cooperative, no obvious depression or anxiety  ASSESSMENT AND PLAN:  Discussed the following assessment and plan:  Essential hypertension - Plan: Basic metabolic panel Discussed etiologies, implications, treatment options, risks. She opted to start norvasc. Labs per orders. She will also notify gyn, but not aware of current med causing this. Follow up 1 month, sooner as needed. tx stress advised.  Hypothyroidism, unspecified type - Plan: TSH Check levels, consider transition to levothyroxine. She just got new bottle so prefers to cont if levels ok.  -Patient advised to return or notify a  doctor immediately if symptoms worsen or persist or new concerns arise.  Patient Instructions  BEFORE YOU LEAVE: -labs -follow up: 1 month hypertension  START the Norvasc (amlodipine) 5 mg and take once daily each morning for elevated blood pressure.  We have ordered labs or studies at this visit. It can take up to 1-2 weeks for results and processing. IF results require follow up or explanation, we will call you with  instructions. Clinically stable results will be released to your [facility]. If you have not heard from [place] or cannot find your results in [facility] in 2 weeks please contact our office at [phone].  If you are not yet signed up for [facility], please consider signing up.  Consider counseling (if not already doing) for stress. Dennison Bulla with [facility] is great.          Lucretia Kern, DO

## 2018-05-27 LAB — BASIC METABOLIC PANEL
BUN: 14 mg/dL (ref 6–23)
CHLORIDE: 101 meq/L (ref 96–112)
CO2: 30 meq/L (ref 19–32)
CREATININE: 0.8 mg/dL (ref 0.40–1.20)
Calcium: 10.1 mg/dL (ref 8.4–10.5)
GFR: 75.17 mL/min (ref >60.00)
GLUCOSE: 94 mg/dL (ref 70–99)
Potassium: 4.6 mEq/L (ref 3.5–5.1)
Sodium: 139 mEq/L (ref 135–145)

## 2018-05-27 LAB — TSH: TSH: 1.43 u[IU]/mL (ref 0.35–4.50)

## 2018-07-09 ENCOUNTER — Encounter: Payer: Self-pay | Admitting: Family Medicine

## 2018-08-10 ENCOUNTER — Other Ambulatory Visit: Payer: Self-pay | Admitting: Family Medicine

## 2018-11-07 ENCOUNTER — Other Ambulatory Visit: Payer: Self-pay | Admitting: Family Medicine

## 2018-11-09 ENCOUNTER — Other Ambulatory Visit: Payer: Self-pay | Admitting: Family Medicine

## 2018-11-10 MED ORDER — THYROID 15 MG PO TABS: ORAL_TABLET | ORAL | 0 refills | Status: DC

## 2018-11-10 NOTE — Telephone Encounter (Signed)
Pt called to inquire about the refill status for thyroid (ARMOUR THYROID) 15 MG tablet And wanted to know why her last refill wasn't for the full year like it normally is/ Please advise if Pt needs an appt for this refill   Send to   [place], [place] - [address] [phone] (Phone) [phone] (Fax)

## 2018-11-10 NOTE — Telephone Encounter (Signed)
I left a message for the pt to return my call as per the last office note she is due for an appt.  Rx sent for a 90 day supply and generally a 39-month supply is sent in. CRM also created.

## 2018-11-17 ENCOUNTER — Encounter: Payer: Self-pay | Admitting: Family Medicine

## 2019-02-08 ENCOUNTER — Other Ambulatory Visit: Payer: Self-pay | Admitting: Family Medicine

## 2019-02-11 ENCOUNTER — Telehealth (INDEPENDENT_AMBULATORY_CARE_PROVIDER_SITE_OTHER): Payer: 59 | Admitting: Family Medicine

## 2019-02-11 ENCOUNTER — Other Ambulatory Visit: Payer: Self-pay

## 2019-02-11 ENCOUNTER — Encounter: Payer: Self-pay | Admitting: Family Medicine

## 2019-02-11 VITALS — BP 124/78

## 2019-02-11 DIAGNOSIS — E039 Hypothyroidism, unspecified: Secondary | ICD-10-CM

## 2019-02-11 DIAGNOSIS — R030 Elevated blood-pressure reading, without diagnosis of hypertension: Secondary | ICD-10-CM | POA: Diagnosis not present

## 2019-02-11 DIAGNOSIS — R21 Rash and other nonspecific skin eruption: Secondary | ICD-10-CM | POA: Diagnosis not present

## 2019-02-11 NOTE — Progress Notes (Signed)
Virtual Visit via Video Note  I connected with Teresa Bridges  on 02/11/19 at  3:00 PM EDT by a video enabled telemedicine application and verified that I am speaking with the correct person using two identifiers.  Location patient: home Location provider:work or home office Persons participating in the virtual visit: patient, provider  I discussed the limitations of evaluation and management by telemedicine and the availability of in person appointments. The patient expressed understanding and agreed to proceed.    HPI:  Seen for follow up. She ended up not taking a blood pressure medication after her last visit. Reports it came down when stopped medication from gyn. She has been monitoring her BP at home and it has been in the 120s/70s usually. She feels her thyroid has been stable and prefers to avoid the office for a recheck for now given the pandemic. She requests refills on her thyroid medication. Denies changes in weight, heat/cold intol, energy issues, etc.  Has new concern of itchy rash in armpits. [place] the beach [place]. hct cream seemed to help. Itchy bumps in both armpits.  BP 124/78 Exercising daily, eating healthy.     ROS: See pertinent positives and negatives per HPI.  Past Medical History:  Diagnosis Date  . Allergy   . BCC (basal cell carcinoma), history of, sees dermatologist 06/20/2016  . Endometriosis 1998  . Headache   . Hyperlipidemia   . Hypothyroidism   . In vitro fertilization   . Nephrolithiasis   . Panic attack   . PIH (pregnancy induced hypertension)   . Pre-eclampsia     Past Surgical History:  Procedure Laterality Date  . BREAST CYST EXCISION  1998  . BUNIONECTOMY Left 05/25/2015   Procedure: MODIFIED MCBRIDE BUNIONECTOMY;  Surgeon: Wylene Simmer, MD;  Location: [place];  Service: Orthopedics;  Laterality: Left;  . EXCISION MASS LOWER EXTREMETIES Left 05/25/2015   Procedure: EXCISION MASS LEFT LATERAL FOOT;  Surgeon: Wylene Simmer, MD;  Location: [place];  Service: Orthopedics;  Laterality: Left;  . LAPAROSCOPY  1998  . METATARSAL OSTEOTOMY Left 05/25/2015   Procedure: LEFT FIRST MT SCARF OSTEOTOMY;  Surgeon: Wylene Simmer, MD;  Location: [place];  Service: Orthopedics;  Laterality: Left;  . upper jaw surgery      Family History  Problem Relation Age of Onset  . Arthritis Mother   . Heart disease Father   . Hyperlipidemia Father   . Hypertension Father   . Cancer Maternal Grandmother        breast  . Heart disease Maternal Grandfather   . Alcohol abuse Paternal Grandmother     SOCIAL HX: see hpi   Current Outpatient Medications:  .  thyroid (ARMOUR THYROID) 15 MG tablet, TAKE ONE AND ONE-HALF TABLETS ONCE DAILY (NEED AN APPOINTMENT), Disp: 135 tablet, Rfl: 0  EXAM:  VITALS per patient if applicable: Today's Vitals   02/11/19 1527  BP: 124/78   There is no height or weight on file to calculate BMI.   GENERAL: alert, oriented, appears well and in no acute distress  HEENT: atraumatic, conjunttiva clear, no obvious abnormalities on inspection of external nose and ears  NECK: normal movements of the head and neck  LUNGS: on inspection no signs of respiratory distress, breathing rate appears normal, no obvious gross SOB, gasping or wheezing  CV: no obvious cyanosis  MS: moves all visible extremities without noticeable abnormality  SKIN: erythematous small papules in both axillary regions  at hair follicles  PSYCH/NEURO: pleasant and cooperative, no obvious depression or anxiety, speech and thought processing grossly intact  ASSESSMENT AND PLAN:  Discussed the following assessment and plan:  Elevated BP without diagnosis of hypertension -glad BP has come back down and may have been medication side effect -continue home monitoring, discussed goal BP 12/70  Hypothyroidism, unspecified type -refills sent -she prefers to avoid office for now in light of  the pandemic, labs have been stable for several years -needs PCP visit, prefers to see Dr. Ethlyn Gallery and follow with me when virtual care appropriate, perhaps can check her TSH then if comfortable  Rash -query folliculitis, yeast seems most likely given appearance and pruritis. Opted for trial topical antifungal. She was advised to call or let us know if not resolved in 1 week. Would then try doxy or antibacterial treatment.  -we discussed possible serious and likely etiologies, options for evaluation and workup, limitations of telemedicine visit vs in person visit, treatment, treatment risks and precautions. Pt prefers to treat via telemedicine empirically rather then risking or undertaking an in person visit at this moment. Patient agrees to seek prompt in person care if worsening, new symptoms arise, or if is not improving with treatment.   I discussed the assessment and treatment plan with the patient. The patient was provided an opportunity to ask questions and all were answered. The patient agreed with the plan and demonstrated an understanding of the instructions.   The patient was advised to call back or seek an in-person evaluation if the symptoms worsen or if the condition fails to improve as anticipated.   Lucretia Kern, DO

## 2019-02-12 ENCOUNTER — Telehealth: Payer: Self-pay | Admitting: Family Medicine

## 2019-02-12 NOTE — Telephone Encounter (Signed)
Spoke to pt, she doesn't know what she is doing about TOC yet, she will call back.

## 2019-02-13 IMAGING — RF DG ESOPHAGUS
6 series · 15 of 17 positions shown · non-contrast
Comparison: None.

CLINICAL DATA: Patient reports intermittent dysphagia with food
holding up in the upper esophagus.

EXAM:
ESOPHOGRAM/BARIUM SWALLOW
TECHNIQUE: Combined double contrast and single contrast examination performed
using effervescent crystals, thick barium liquid, and thin barium
liquid.
FLUOROSCOPY TIME:  Fluoroscopy Time:  1 minutes and 30 seconds
Radiation Exposure Index (if provided by the fluoroscopic device):
40 mGy
Number of Acquired Spot Images: 6

[Series 1: one shot · 1 of 1 slices shown (1 of 3)]
[im 1/1]
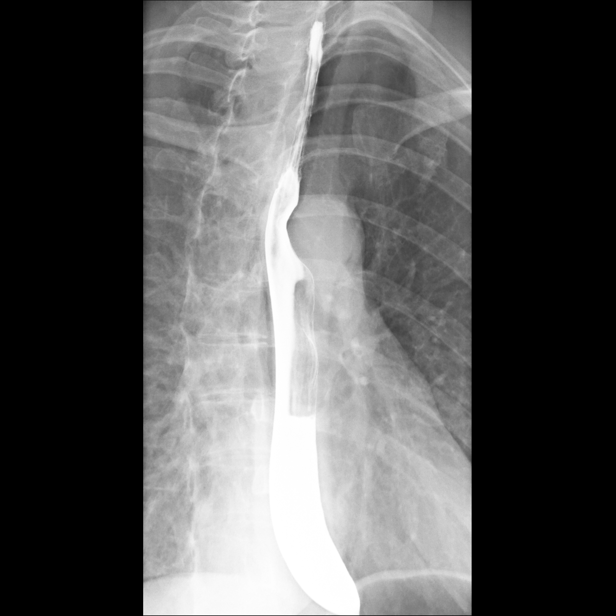

[Series 2: sequence · 3 of 7 frames shown (1 of 3)]
[frame 2/7]
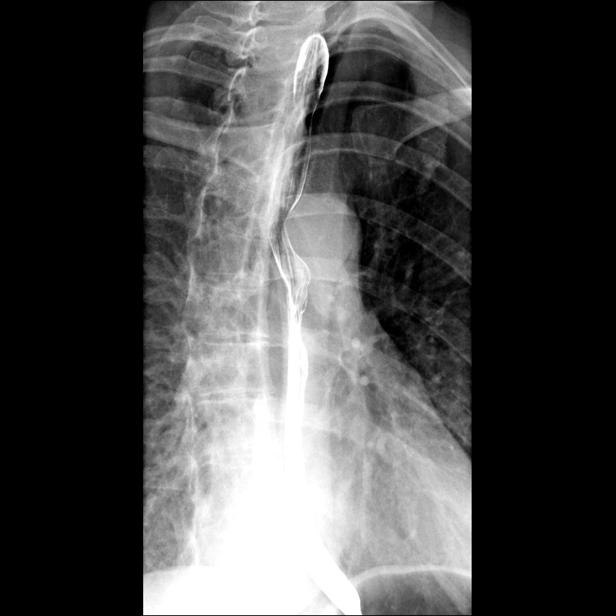
[frame 3/7]
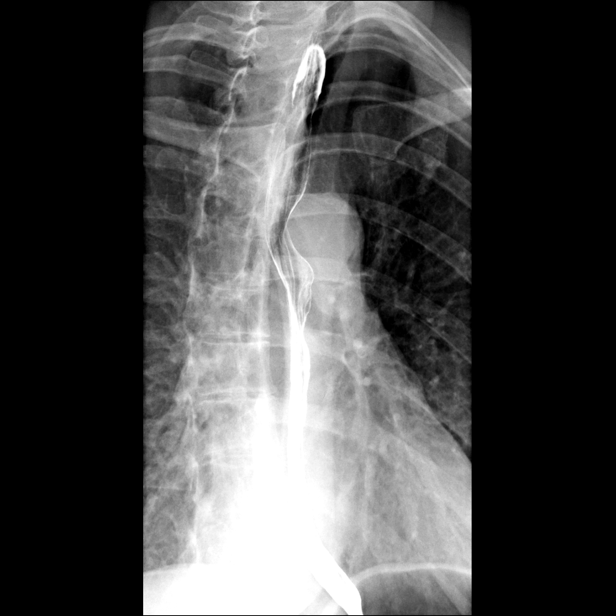
[frame 4/7]
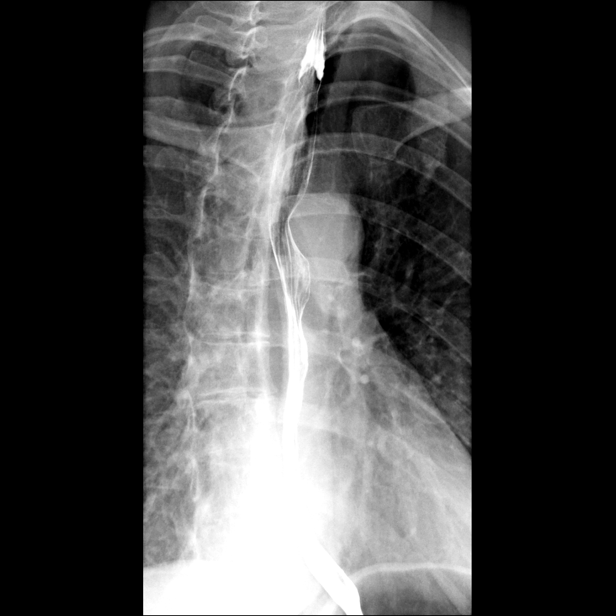

[Series 3: sequence · 3 of 5 frames shown (2 of 3)]
[frame 1/5]
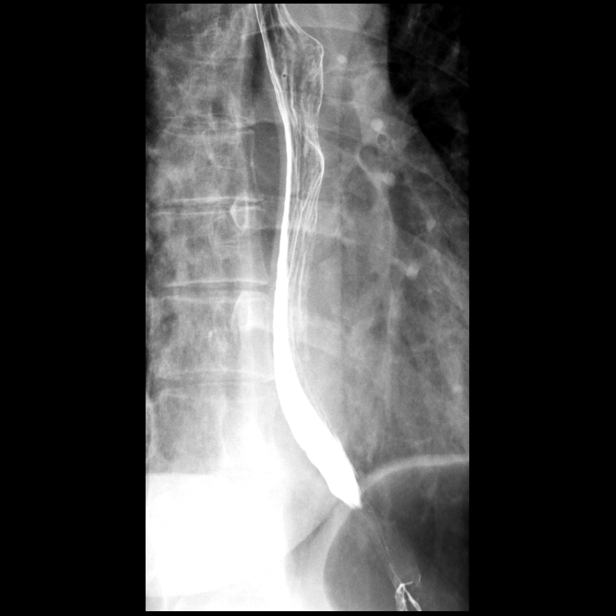
[frame 3/5]
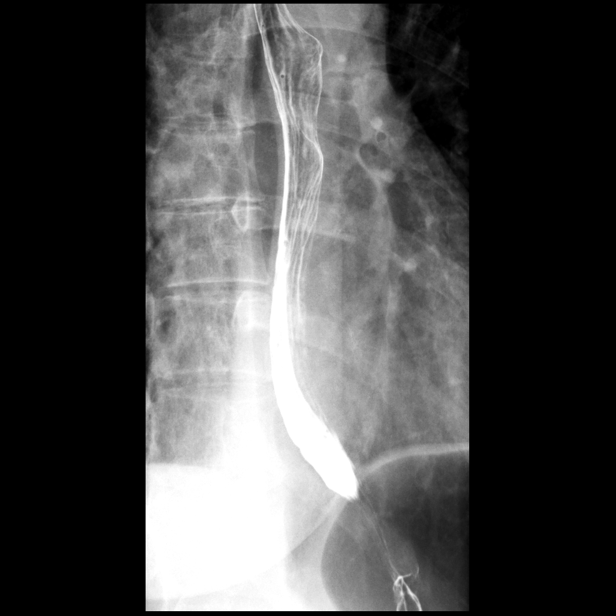
[frame 5/5]
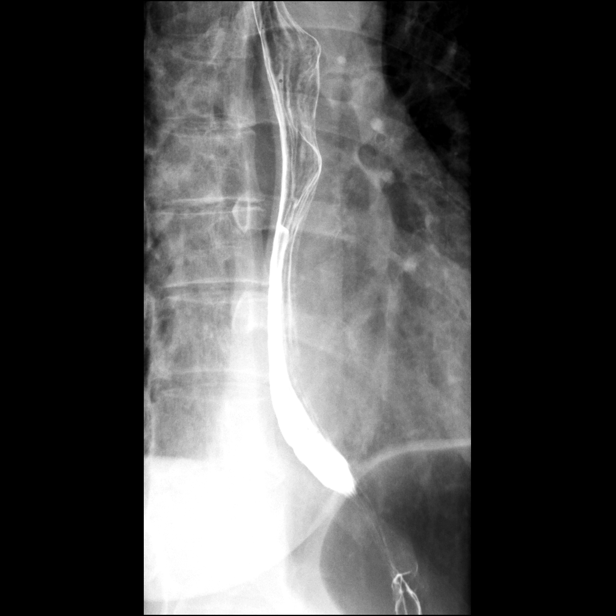

[Series 4: one shot · 2 of 2 slices shown (2 of 3)]
[im 1/2]
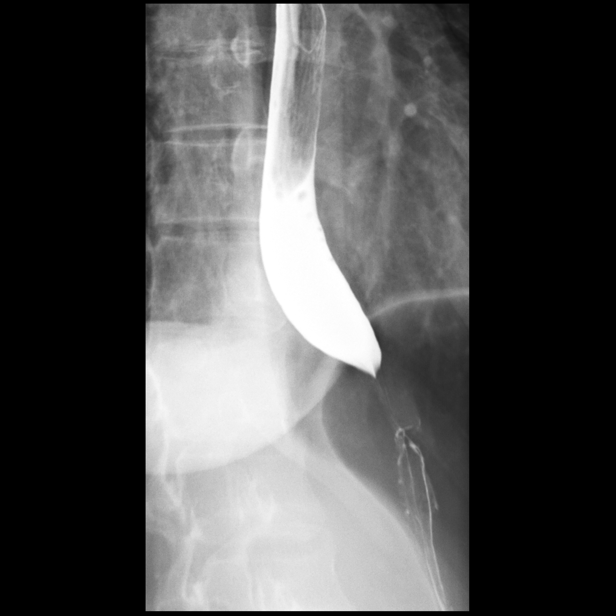
[im 2/2]
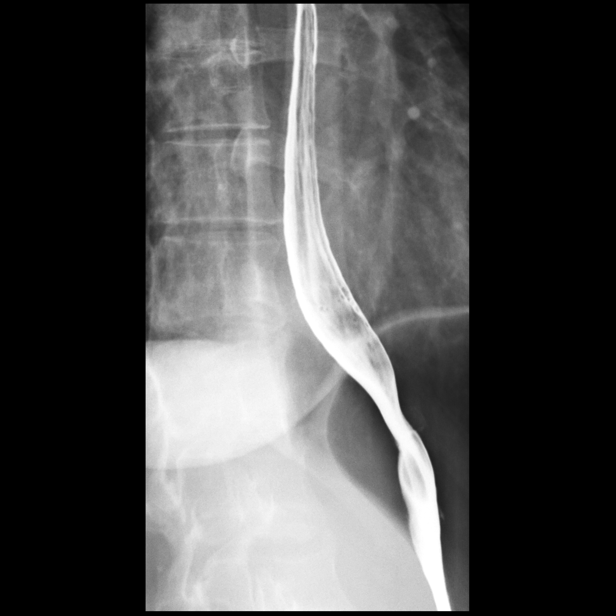

[Series 5: sequence · 3 of 20 frames shown (3 of 3)]
[frame 4/20]
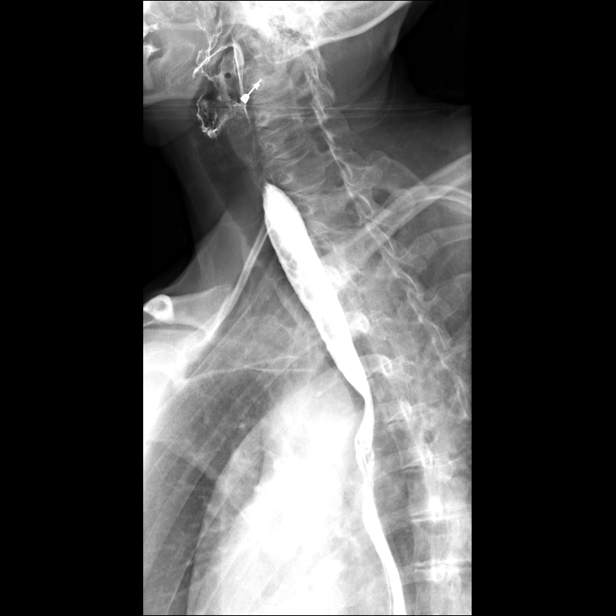
[frame 11/20]
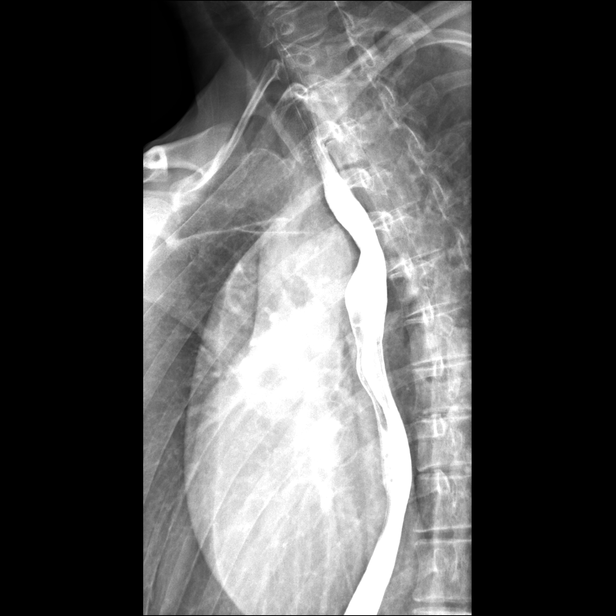
[frame 18/20]
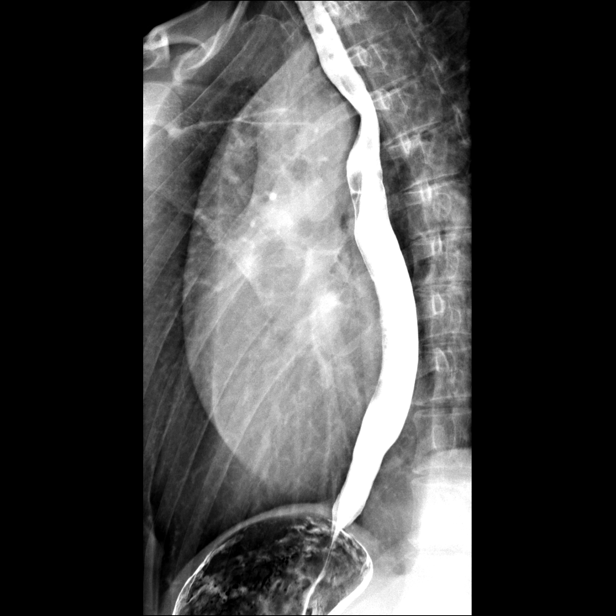

[Series 6: one shot · 3 of 3 slices shown (3 of 3)]
[im 1/3]
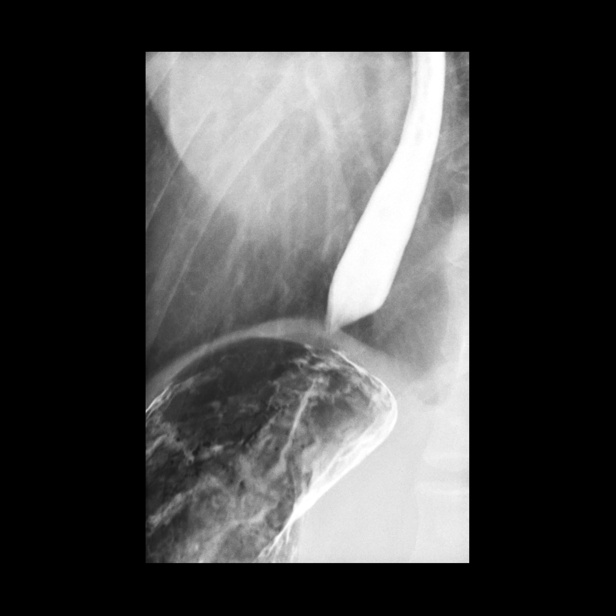
[im 2/3]
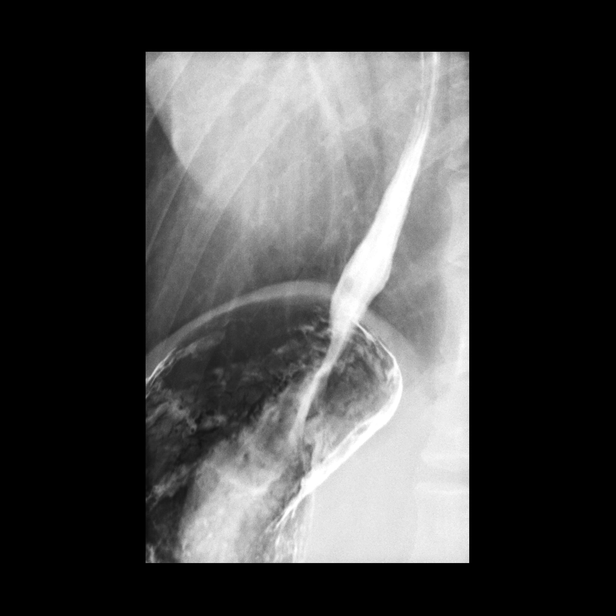
[im 3/3]
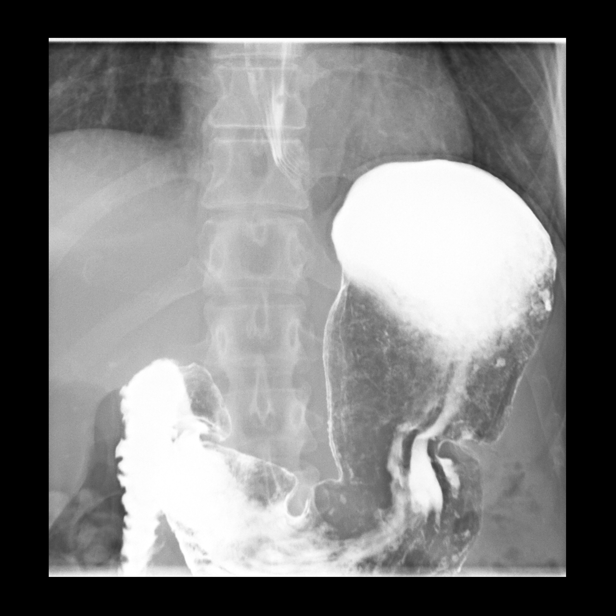

[15 of 17 positions shown; findings below may reference images not displayed]

FINDINGS: Pharyngeal swallowing function is normal.

The esophagus is normal course and in caliber. There is no mucosal
abnormality. Motility was within normal limits. No reflux was
documented during the exam.

The gastric cardia and fundus are unremarkable.
IMPRESSION: Normal biphasic barium swallow study.

## 2019-03-08 ENCOUNTER — Telehealth: Payer: Self-pay | Admitting: *Deleted

## 2019-03-08 NOTE — Telephone Encounter (Signed)
Copied from [place] [id number]. Topic: Appointment Scheduling - Scheduling Inquiry for Clinic >> Mar 08, 2019 11:10 AM Alanda Slim E wrote: Reason for CRM: Pt was advised by Dr. Maudie Mercury to have thyroid check / Please advise for blood work orders

## 2019-03-08 NOTE — Telephone Encounter (Signed)
I do not see any future lab orders and per last office notes, Dr Maudie Mercury recommended labs when she pt comes in for a transfer of care visit with new PCP.  Left a message for the pt to return my call.  CRM also created.

## 2019-03-09 NOTE — Telephone Encounter (Signed)
Patient called in stating she is returning call from office. No note seen. Advised patient to schedule a TOC appointment so we can move forward with labs and pt stated she has not made up her mind yet and will be calling back whenever she figures out who she would like to see. Phone system crashed shortly after and was unable to reconnect with pt.

## 2019-03-09 NOTE — Telephone Encounter (Signed)
Noted  

## 2019-03-15 ENCOUNTER — Other Ambulatory Visit: Payer: Self-pay

## 2019-03-15 DIAGNOSIS — Z20822 Contact with and (suspected) exposure to covid-19: Secondary | ICD-10-CM

## 2019-03-17 LAB — NOVEL CORONAVIRUS, NAA: SARS-CoV-2, NAA: NOT DETECTED

## 2019-03-19 ENCOUNTER — Other Ambulatory Visit: Payer: Self-pay

## 2019-03-19 DIAGNOSIS — Z20822 Contact with and (suspected) exposure to covid-19: Secondary | ICD-10-CM

## 2019-03-22 ENCOUNTER — Encounter: Payer: Self-pay | Admitting: Family Medicine

## 2019-03-22 LAB — NOVEL CORONAVIRUS, NAA: SARS-CoV-2, NAA: NOT DETECTED

## 2019-05-09 ENCOUNTER — Other Ambulatory Visit: Payer: Self-pay | Admitting: Family Medicine

## 2020-02-04 ENCOUNTER — Other Ambulatory Visit: Payer: Self-pay | Admitting: Orthopedic Surgery

## 2020-07-12 ENCOUNTER — Encounter: Payer: Self-pay | Admitting: Obstetrics and Gynecology

## 2020-07-21 ENCOUNTER — Encounter: Payer: Self-pay | Admitting: Obstetrics and Gynecology

## 2020-07-21 ENCOUNTER — Ambulatory Visit (INDEPENDENT_AMBULATORY_CARE_PROVIDER_SITE_OTHER): Payer: 59 | Admitting: Obstetrics and Gynecology

## 2020-07-21 ENCOUNTER — Other Ambulatory Visit: Payer: Self-pay

## 2020-07-21 VITALS — BP 118/70 | HR 103 | Ht 63.5 in | Wt 118.0 lb

## 2020-07-21 DIAGNOSIS — N952 Postmenopausal atrophic vaginitis: Secondary | ICD-10-CM

## 2020-07-21 DIAGNOSIS — Z01419 Encounter for gynecological examination (general) (routine) without abnormal findings: Secondary | ICD-10-CM

## 2020-07-21 MED ORDER — ESTRADIOL 0.1 MG/GM VA CREA: TOPICAL_CREAM | VAGINAL | 1 refills | Status: DC

## 2020-07-21 NOTE — Progress Notes (Signed)
55 y.o. G81P1001 Married Caucasian female here as a new patient for an annual exam.    No menses for at least 3 years. Had hot flashes, which are better now.  Wakes up hot but not drenched.  Dry skin.  Vaginal dryness and pain.  Tried vaginal estradiol tablet and cream and had menstrual cramping.  She did not notice a difference.  Took Covid booster.  Has a son, 17 yo. From [place] originally. Works at the [employer].  PCP: Bernerd Limbo, MD   No LMP recorded (lmp unknown). Patient is perimenopausal.           Sexually active: Yes.    The current method of family planning is condoms sometimes.    Exercising: Yes.    daily Smoker:  no  Health Maintenance: Pap:  About 1 year ago with [facility] History of abnormal Pap:  Yes, per patient years ago and colpo done. Normal since MMG:   06/02/20.  BI-RADS1, cat C density.  [facility]. Colonoscopy:  3 years ago per patient f/u 5 years BMD:   n/a  Result  n/a TDaP:  04/09/10 Gardasil:   n/a HIV: never Hep C: 2015 negative Screening Labs:  PCP.   reports that she has never smoked. She has never used smokeless tobacco. She reports current alcohol use. She reports that she does not use drugs.  Past Medical History:  Diagnosis Date  . Allergy   . BCC (basal cell carcinoma), history of, sees dermatologist 06/20/2016  . Endometriosis 1998  . Headache   . Hyperlipidemia   . Hypothyroidism   . In vitro fertilization   . Nephrolithiasis   . Panic attack   . PIH (pregnancy induced hypertension)   . Pre-eclampsia     Past Surgical History:  Procedure Laterality Date  . BREAST CYST EXCISION  1998  . BUNIONECTOMY Left 05/25/2015   Procedure: MODIFIED MCBRIDE BUNIONECTOMY;  Surgeon: Wylene Simmer, MD;  Location: [place];  Service: Orthopedics;  Laterality: Left;  . COLPOSCOPY    . EXCISION MASS LOWER EXTREMETIES Left 05/25/2015   Procedure: EXCISION MASS LEFT LATERAL FOOT;  Surgeon: Wylene Simmer, MD;  Location: [place];  Service: Orthopedics;  Laterality: Left;  . LAPAROSCOPY  1998  . METATARSAL OSTEOTOMY Left 05/25/2015   Procedure: LEFT FIRST MT SCARF OSTEOTOMY;  Surgeon: Wylene Simmer, MD;  Location: [place];  Service: Orthopedics;  Laterality: Left;  . upper jaw surgery      Current Outpatient Medications  Medication Sig Dispense Refill  . Calcium Carbonate-Vit D-Min (CALCIUM 1200 PO) Take by mouth.    . fexofenadine (ALLEGRA) 180 MG tablet Take 180 mg by mouth daily.    . fluticasone (FLONASE) 50 MCG/ACT nasal spray Place into both nostrils daily.    Marland Kitchen MAGNESIUM CITRATE PO magnesium    . Multiple Vitamin (MULTIVITAMIN) tablet Take 1 tablet by mouth daily.    . Omega 3-6-9 Fatty Acids (OMEGA-3-6-9 PO) Take by mouth.    . thyroid (ARMOUR THYROID) 15 MG tablet TAKE ONE AND ONE-HALF TABLETS ONCE DAILY (NEED AN APPOINTMENT) 135 tablet 0  . Vitamin D, Cholecalciferol, 25 MCG (1000 UT) CAPS Take by mouth.    Marland Kitchen ZINC OXIDE PO Take by mouth.     No current facility-administered medications for this visit.    Family History  Problem Relation Age of Onset  . Arthritis Mother   . Thyroid cancer Mother   . Melanoma Mother  retinal  . Aneurysm Mother        unruptured  . Fibroids Mother   . Hypertension Father   . Melanoma Father   . Squamous cell carcinoma Father   . Cancer Maternal Grandmother        breast  . Fibroids Maternal Grandmother   . Heart disease Maternal Grandfather   . Alcohol abuse Paternal Grandmother     Review of Systems  Constitutional: Negative.   HENT: Negative.   Eyes: Negative.   Respiratory: Negative.   Cardiovascular: Negative.   Gastrointestinal: Negative.   Endocrine: Negative.   Genitourinary: Negative.   Musculoskeletal: Negative.   Skin: Negative.   Allergic/Immunologic: Negative.   Neurological: Negative.   Hematological: Negative.   Psychiatric/Behavioral: Negative.     Exam:   BP 118/70   Pulse (!) 103   Ht  5' 3.5" (1.613 m)   Wt 118 lb (53.5 kg)   LMP  (LMP Unknown)   SpO2 98%   BMI 20.57 kg/m     General appearance: alert, cooperative and appears stated age Head: normocephalic, without obvious abnormality, atraumatic Neck: no adenopathy, supple, symmetrical, trachea midline and thyroid normal to inspection and palpation Lungs: clear to auscultation bilaterally Breasts: normal appearance, no masses or tenderness, No nipple retraction or dimpling, No nipple discharge or bleeding, No axillary adenopathy Heart: regular rate and rhythm Abdomen: soft, non-tender; no masses, no organomegaly Extremities: extremities normal, atraumatic, no cyanosis or edema Skin: skin color, texture, turgor normal. No rashes or lesions Lymph nodes: cervical, supraclavicular, and axillary nodes normal. Neurologic: grossly normal  Pelvic: External genitalia:  no lesions              No abnormal inguinal nodes palpated.              Urethra:  normal appearing urethra with no masses, tenderness or lesions              Bartholins and Skenes: normal                 Vagina: normal appearing vagina with normal color and discharge, no lesions              Cervix: no lesions              Pap taken: No. Bimanual Exam:  Uterus:  normal size, contour, position, consistency, mobility, non-tender              Adnexa: no mass, fullness, tenderness              Rectal exam: Yes.  .  Confirms.              Anus:  normal sphincter tone, no lesions  Chaperone was present for exam.  Assessment:   Well woman visit with normal exam. Vaginal atrophy.  Plan: Mammogram screening discussed. Self breast awareness reviewed. Pap and HR HPV as above. Guidelines for Calcium, Vitamin D, regular exercise program including cardiovascular and weight bearing exercise. Estrace cream 1/2 gm per vagina at hs x 2 weeks and then 1/2 gm pv at hs 2 -3 times a week.  I instructed her that she can use less than this.   Imvexxy 4 mcg is also an  option. I did dicussed potential effect on breast cancer. Follow up annually and prn.

## 2020-07-21 NOTE — Patient Instructions (Signed)

## 2021-06-20 ENCOUNTER — Encounter: Payer: Self-pay | Admitting: Obstetrics and Gynecology

## 2021-09-12 ENCOUNTER — Ambulatory Visit: Payer: 59 | Admitting: Obstetrics and Gynecology

## 2021-09-20 NOTE — Progress Notes (Deleted)
56 y.o. G13P1001 Married Caucasian female here for annual exam.    PCP:     No LMP recorded. Patient is perimenopausal.           Sexually active: {yes no:314532}  The current method of family planning is {contraception:315051}.    Exercising: {yes no:314532}  {types:19826} Smoker:  no  Health Maintenance: Pap:  2 yrs ago normal [facility],Pap 04-15-12 normal History of abnormal Pap:  yes-many yrs.ago colpo done--normal since MMG:  06-20-21 normal Bi RADS 1 Cat. C Colonoscopy:  4 yrs.ago-5 yr follow up BMD:   never  Result   TDaP:  *** Gardasil:   no HIV:NR Hep C:Neg Screening Labs:  Hb today: ***, Urine today: ***   reports that she has never smoked. She has never used smokeless tobacco. She reports current alcohol use. She reports that she does not use drugs.  Past Medical History:  Diagnosis Date   Allergy    BCC (basal cell carcinoma), history of, sees dermatologist 06/20/2016   Endometriosis 1998   Headache    Hyperlipidemia    Hypothyroidism    In vitro fertilization    Nephrolithiasis    Panic attack    PIH (pregnancy induced hypertension)    Pre-eclampsia     Past Surgical History:  Procedure Laterality Date   BREAST CYST EXCISION  1998   BUNIONECTOMY Left 05/25/2015   Procedure: MODIFIED MCBRIDE BUNIONECTOMY;  Surgeon: Wylene Simmer, MD;  Location: [place];  Service: Orthopedics;  Laterality: Left;   COLPOSCOPY     EXCISION MASS LOWER EXTREMETIES Left 05/25/2015   Procedure: EXCISION MASS LEFT LATERAL FOOT;  Surgeon: Wylene Simmer, MD;  Location: [place];  Service: Orthopedics;  Laterality: Left;   LAPAROSCOPY  1998   METATARSAL OSTEOTOMY Left 05/25/2015   Procedure: LEFT FIRST MT SCARF OSTEOTOMY;  Surgeon: Wylene Simmer, MD;  Location: [place];  Service: Orthopedics;  Laterality: Left;   upper jaw surgery      Current Outpatient Medications  Medication Sig Dispense Refill   Calcium Carbonate-Vit D-Min  (CALCIUM 1200 PO) Take by mouth.     estradiol (ESTRACE) 0.1 MG/GM vaginal cream Use 1/2 g vaginally every night for the first 2 weeks, then use 1/2 g vaginally two or three times per week as needed to maintain symptom relief. 42.5 g 1   fexofenadine (ALLEGRA) 180 MG tablet Take 180 mg by mouth daily.     fluticasone (FLONASE) 50 MCG/ACT nasal spray Place into both nostrils daily.     MAGNESIUM CITRATE PO magnesium     Multiple Vitamin (MULTIVITAMIN) tablet Take 1 tablet by mouth daily.     Omega 3-6-9 Fatty Acids (OMEGA-3-6-9 PO) Take by mouth.     thyroid (ARMOUR THYROID) 15 MG tablet TAKE ONE AND ONE-HALF TABLETS ONCE DAILY (NEED AN APPOINTMENT) 135 tablet 0   Vitamin D, Cholecalciferol, 25 MCG (1000 UT) CAPS Take by mouth.     ZINC OXIDE PO Take by mouth.     No current facility-administered medications for this visit.    Family History  Problem Relation Age of Onset   Arthritis Mother    Thyroid cancer Mother    Melanoma Mother        retinal   Aneurysm Mother        unruptured   Fibroids Mother    Hypertension Father    Melanoma Father    Squamous cell carcinoma Father    Cancer Maternal Grandmother  breast   Fibroids Maternal Grandmother    Heart disease Maternal Grandfather    Alcohol abuse Paternal Grandmother     Review of Systems  Exam:   There were no vitals taken for this visit.    General appearance: alert, cooperative and appears stated age Head: normocephalic, without obvious abnormality, atraumatic Neck: no adenopathy, supple, symmetrical, trachea midline and thyroid normal to inspection and palpation Lungs: clear to auscultation bilaterally Breasts: normal appearance, no masses or tenderness, No nipple retraction or dimpling, No nipple discharge or bleeding, No axillary adenopathy Heart: regular rate and rhythm Abdomen: soft, non-tender; no masses, no organomegaly Extremities: extremities normal, atraumatic, no cyanosis or edema Skin: skin color,  texture, turgor normal. No rashes or lesions Lymph nodes: cervical, supraclavicular, and axillary nodes normal. Neurologic: grossly normal  Pelvic: External genitalia:  no lesions              No abnormal inguinal nodes palpated.              Urethra:  normal appearing urethra with no masses, tenderness or lesions              Bartholins and Skenes: normal                 Vagina: normal appearing vagina with normal color and discharge, no lesions              Cervix: no lesions              Pap taken: {yes no:314532} Bimanual Exam:  Uterus:  normal size, contour, position, consistency, mobility, non-tender              Adnexa: no mass, fullness, tenderness              Rectal exam: {yes no:314532}.  Confirms.              Anus:  normal sphincter tone, no lesions  Chaperone was present for exam:  ***  Assessment:   Well woman visit with gynecologic exam.   Plan: Mammogram screening discussed. Self breast awareness reviewed. Pap and HR HPV as above. Guidelines for Calcium, Vitamin D, regular exercise program including cardiovascular and weight bearing exercise.   Follow up annually and prn.   Additional counseling given.  {yes [id number]. _______ minutes face to face time of which over 50% was spent in counseling.    After visit summary provided.

## 2021-09-21 ENCOUNTER — Ambulatory Visit: Payer: 59 | Admitting: Obstetrics and Gynecology

## 2021-10-05 ENCOUNTER — Ambulatory Visit: Payer: 59 | Admitting: Obstetrics and Gynecology

## 2021-10-24 ENCOUNTER — Ambulatory Visit: Payer: 59 | Admitting: Obstetrics and Gynecology

## 2021-11-21 NOTE — Progress Notes (Signed)
56 y.o. G64P1001 Married Caucasian female here for annual exam.    Patient complaining of vaginal dryness.  She did not fill her vaginal estrogen prescription last year.   She thinks she used Intrarosa in the past and had cramping.   TDD:UKGUR Bouska, MD    Patient's last menstrual period was 05/06/2017.           Sexually active: Yes.    The current method of family planning is Postmenopausal Exercising: Yes.     Weights, walking Smoker:  no  Health Maintenance: Pap:  2021 normal [facility] History of abnormal Pap:  yes, Hx of colposcopy years ago. Paps normal since. MMG:  06-15-21 Neg/Birads1 Colonoscopy:  2019;next 5 years.  She will call her GI to check on her next colonoscopy. BMD:   n/a  Result  n/a TDaP: PCP Gardasil:   no HIV: never Hep C: 2015 Neg Screening Labs:  PCP   reports that she has never smoked. She has never used smokeless tobacco. She reports current alcohol use. She reports that she does not use drugs.  Past Medical History:  Diagnosis Date   Allergy    BCC (basal cell carcinoma), history of, sees dermatologist 06/20/2016   Endometriosis 1998   Headache    Hyperlipidemia    Hypothyroidism    In vitro fertilization    Nephrolithiasis    Panic attack    PIH (pregnancy induced hypertension)    Pre-eclampsia     Past Surgical History:  Procedure Laterality Date   BREAST CYST EXCISION  1998   BUNIONECTOMY Left 05/25/2015   Procedure: MODIFIED MCBRIDE BUNIONECTOMY;  Surgeon: Wylene Simmer, MD;  Location: [place];  Service: Orthopedics;  Laterality: Left;   COLPOSCOPY     EXCISION MASS LOWER EXTREMETIES Left 05/25/2015   Procedure: EXCISION MASS LEFT LATERAL FOOT;  Surgeon: Wylene Simmer, MD;  Location: [place];  Service: Orthopedics;  Laterality: Left;   LAPAROSCOPY  1998   METATARSAL OSTEOTOMY Left 05/25/2015   Procedure: LEFT FIRST MT SCARF OSTEOTOMY;  Surgeon: Wylene Simmer, MD;  Location: [place];  Service: Orthopedics;  Laterality: Left;   upper jaw surgery      Current Outpatient Medications  Medication Sig Dispense Refill   ALPRAZolam (XANAX) 0.5 MG tablet Take by mouth.     b complex vitamins capsule Take 1 capsule by mouth daily.     Calcium Carbonate-Vit D-Min (CALCIUM 1200 PO) Take by mouth.     fexofenadine (ALLEGRA) 180 MG tablet Take 180 mg by mouth daily.     fluticasone (FLONASE) 50 MCG/ACT nasal spray Place into both nostrils daily.     hydrocortisone 2.5 % cream SMARTSIG:In Ear(s) Twice Daily PRN     ketoconazole (NIZORAL) 2 % cream Apply 1 Application topically 2 (two) times daily.     MAGNESIUM CITRATE PO magnesium     Multiple Vitamin (MULTIVITAMIN) tablet Take 1 tablet by mouth daily.     Omega 3-6-9 Fatty Acids (OMEGA-3-6-9 PO) Take by mouth.     temazepam (RESTORIL) 7.5 MG capsule Take by mouth.     thyroid (ARMOUR THYROID) 15 MG tablet TAKE ONE AND ONE-HALF TABLETS ONCE DAILY (NEED AN APPOINTMENT) 135 tablet 0   Vitamin D, Cholecalciferol, 25 MCG (1000 UT) CAPS Take by mouth.     ZINC OXIDE PO Take by mouth.     Estradiol (VAGIFEM) 10 MCG TABS vaginal tablet Place one tablet (10 mcg) per vagina at hs for 2  weeks.  Then place one tablet per vagina at hs twice a week. 34 tablet 3   No current facility-administered medications for this visit.    Family History  Problem Relation Age of Onset   Arthritis Mother    Thyroid cancer Mother    Melanoma Mother        retinal   Aneurysm Mother        unruptured   Fibroids Mother    Hypertension Father    Melanoma Father    Squamous cell carcinoma Father    Cancer Maternal Grandmother        breast   Fibroids Maternal Grandmother    Heart disease Maternal Grandfather    Alcohol abuse Paternal Grandmother     Review of Systems  Genitourinary:  Positive for frequency.       Vaginal dryness,  All other systems reviewed and are negative.   Exam:   BP (!) 180/100   Pulse (!) 108   Ht '5\' 3"'$  (1.6  m)   Wt 120 lb (54.4 kg)   LMP 05/06/2017   SpO2 99%   BMI 21.26 kg/m     General appearance: alert, cooperative and appears stated age Head: normocephalic, without obvious abnormality, atraumatic Neck: no adenopathy, supple, symmetrical, trachea midline and thyroid normal to inspection and palpation Lungs: clear to auscultation bilaterally Breasts: normal appearance, no masses or tenderness, No nipple retraction or dimpling, No nipple discharge or bleeding, No axillary adenopathy Heart: regular rate and rhythm Abdomen: soft, non-tender; no masses, no organomegaly Extremities: extremities normal, atraumatic, no cyanosis or edema Skin: skin color, texture, turgor normal. No rashes or lesions Lymph nodes: cervical, supraclavicular, and axillary nodes normal. Neurologic: grossly normal  Pelvic: External genitalia:  no lesions              No abnormal inguinal nodes palpated.              Urethra:  normal appearing urethra with no masses, tenderness or lesions              Bartholins and Skenes: normal                 Vagina: normal appearing vagina with normal color and discharge, no lesions              Cervix: no lesions              Pap taken: no Bimanual Exam:  Uterus:  normal size, contour, position, consistency, mobility, non-tender              Adnexa: no mass, fullness, tenderness              Rectal exam: yes.  Confirms.              Anus:  normal sphincter tone, no lesions  Chaperone was present for exam:  Estill Bamberg, CMA  Assessment:   Well woman visit with gynecologic exam. Vaginal atrophy.  Urinary frequency.   Plan: Mammogram screening discussed. Self breast awareness reviewed. Pap and HR HPV 2026. Guidelines for Calcium, Vitamin D, regular exercise program including cardiovascular and weight bearing exercise. Rx for Vagifem 10 mcg generic.  I discussed potential effect on breast cancer.  We also discussed Josph Macho as a last step for treating atrophy if other  methods are not effective.  Up to date literature reviewed with patient.  Urinalysis:  normal.  No UC sent.  Follow up annually and prn.    After visit  summary provided.

## 2021-11-28 ENCOUNTER — Encounter: Payer: Self-pay | Admitting: Obstetrics and Gynecology

## 2021-11-28 ENCOUNTER — Ambulatory Visit (INDEPENDENT_AMBULATORY_CARE_PROVIDER_SITE_OTHER): Payer: 59 | Admitting: Obstetrics and Gynecology

## 2021-11-28 VITALS — BP 180/100 | HR 108 | Ht 63.0 in | Wt 120.0 lb

## 2021-11-28 DIAGNOSIS — R350 Frequency of micturition: Secondary | ICD-10-CM | POA: Diagnosis not present

## 2021-11-28 DIAGNOSIS — Z01419 Encounter for gynecological examination (general) (routine) without abnormal findings: Secondary | ICD-10-CM | POA: Diagnosis not present

## 2021-11-28 DIAGNOSIS — N952 Postmenopausal atrophic vaginitis: Secondary | ICD-10-CM

## 2021-11-28 LAB — URINALYSIS, COMPLETE W/RFL CULTURE
Bacteria, UA: NONE SEEN /HPF
Bilirubin Urine: NEGATIVE
Glucose, UA: NEGATIVE
Hgb urine dipstick: NEGATIVE
Hyaline Cast: NONE SEEN /LPF
Ketones, ur: NEGATIVE
Leukocyte Esterase: NEGATIVE
Nitrites, Initial: NEGATIVE
Protein, ur: NEGATIVE
RBC / HPF: NONE SEEN /HPF (ref 0–2)
Specific Gravity, Urine: 1.015 (ref 1.001–1.035)
WBC, UA: NONE SEEN /HPF (ref 0–5)
pH: 7.5 (ref 5.0–8.0)

## 2021-11-28 LAB — NO CULTURE INDICATED

## 2021-11-28 MED ORDER — ESTRADIOL 10 MCG VA TABS: ORAL_TABLET | VAGINAL | 3 refills | Status: DC

## 2021-11-28 NOTE — Patient Instructions (Signed)

## 2021-12-03 ENCOUNTER — Encounter: Payer: Self-pay | Admitting: Obstetrics and Gynecology

## 2021-12-05 NOTE — Telephone Encounter (Signed)
Instead of the estradiol tablets, she could try estradiol vaginal cream 0.01%, 1/2 gram pv at hs for 2 weeks and then 1/2 gram pv at hs twice weekly.  Disp:  42.5 grams, RF:  two.   Please discontinue the Vagifem tablets if she would like to use the cream.

## 2022-07-01 ENCOUNTER — Encounter: Payer: Self-pay | Admitting: Obstetrics and Gynecology

## 2022-12-02 ENCOUNTER — Ambulatory Visit: Payer: 59 | Admitting: Obstetrics and Gynecology

## 2022-12-04 NOTE — Progress Notes (Signed)
57 y.o. G38P1001 Married Caucasian female here for annual exam.    Some urinary irritation and urgency.   Discomfort with intercourse. She did not use the Vagifem.    Cream is messy.  Intrarosa caused cramping.   Decreased desire for sex.   PCP:  Theotis Barrio, MD   Patient's last menstrual period was 05/06/2017.           Sexually active: Yes.    The current method of family planning is post menopausal status.    Exercising: Yes.     Weight lifting, walking, cardio Smoker:  no  Health Maintenance: Pap:  05/27/19 - unsatisfactory pap with scan cells, 05/19/18 - normal, neg HR HPV - Dr. Stefano Gaul History of abnormal Pap:  yes, per patient years ago and colpo done. Normal since  MMG:  06/28/22 Breast density Cat C, BI-RADS CAT 1 neg Colonoscopy:  03/13/22 BMD:   n/a  Result  n/a TDaP:  04/09/10 - she will check with her PCP Gardasil:   no HIV: n/a Hep C: 2015 neg Screening Labs:  PCP   reports that she has never smoked. She has never used smokeless tobacco. She reports current alcohol use. She reports that she does not use drugs.  Past Medical History:  Diagnosis Date   Allergy    BCC (basal cell carcinoma), history of, sees dermatologist 06/20/2016   Endometriosis 1998   Headache    Hyperlipidemia    Hypothyroidism    In vitro fertilization    Nephrolithiasis    Panic attack    PIH (pregnancy induced hypertension)    Pre-eclampsia     Past Surgical History:  Procedure Laterality Date   BREAST CYST EXCISION  1998   BUNIONECTOMY Left 05/25/2015   Procedure: MODIFIED MCBRIDE BUNIONECTOMY;  Surgeon: Toni Arthurs, MD;  Location: [facility];  Service: Orthopedics;  Laterality: Left;   COLPOSCOPY     EXCISION MASS LOWER EXTREMETIES Left 05/25/2015   Procedure: EXCISION MASS LEFT LATERAL FOOT;  Surgeon: Toni Arthurs, MD;  Location: [facility];  Service: Orthopedics;  Laterality: Left;   LAPAROSCOPY  1998   METATARSAL OSTEOTOMY Left 05/25/2015    Procedure: LEFT FIRST MT SCARF OSTEOTOMY;  Surgeon: Toni Arthurs, MD;  Location: [facility];  Service: Orthopedics;  Laterality: Left;   upper jaw surgery      Current Outpatient Medications  Medication Sig Dispense Refill   ALPRAZolam (XANAX) 0.5 MG tablet Take by mouth.     b complex vitamins capsule Take 1 capsule by mouth daily.     Calcium Carbonate-Vit D-Min (CALCIUM 1200 PO) Take by mouth.     fexofenadine (ALLEGRA) 180 MG tablet Take 180 mg by mouth daily.     fluticasone (FLONASE) 50 MCG/ACT nasal spray Place into both nostrils daily.     MAGNESIUM CITRATE PO magnesium     Multiple Vitamin (MULTIVITAMIN) tablet Take 1 tablet by mouth daily.     Omega 3-6-9 Fatty Acids (OMEGA-3-6-9 PO) Take by mouth.     temazepam (RESTORIL) 7.5 MG capsule Take by mouth.     thyroid (ARMOUR THYROID) 15 MG tablet TAKE ONE AND ONE-HALF TABLETS ONCE DAILY (NEED AN APPOINTMENT) 135 tablet 0   Vitamin D, Cholecalciferol, 25 MCG (1000 UT) CAPS Take by mouth.     ZINC OXIDE PO Take by mouth.     Estradiol (VAGIFEM) 10 MCG TABS vaginal tablet Place one tablet (10 mcg) per vagina at hs for 2 weeks.  Then place  one tablet per vagina at hs twice a week. (Patient not taking: Reported on 12/18/2022) 34 tablet 3   hydrocortisone 2.5 % cream SMARTSIG:In Ear(s) Twice Daily PRN (Patient not taking: Reported on 12/18/2022)     ketoconazole (NIZORAL) 2 % cream Apply 1 Application topically 2 (two) times daily. (Patient not taking: Reported on 12/18/2022)     No current facility-administered medications for this visit.    Family History  Problem Relation Age of Onset   Arthritis Mother    Thyroid cancer Mother    Melanoma Mother        retinal   Aneurysm Mother        unruptured   Fibroids Mother    Hypertension Father    Melanoma Father    Squamous cell carcinoma Father    Cancer Maternal Grandmother        breast   Fibroids Maternal Grandmother    Heart disease Maternal Grandfather     Alcohol abuse Paternal Grandmother     Review of Systems  All other systems reviewed and are negative.   Exam:   BP 136/84 (BP Location: Right Arm, Patient Position: Sitting, Cuff Size: Normal)   Pulse 95   Ht 5\' 3"  (1.6 m)   Wt 120 lb (54.4 kg)   LMP 05/06/2017   SpO2 100%   BMI 21.26 kg/m     General appearance: alert, cooperative and appears stated age Head: normocephalic, without obvious abnormality, atraumatic Neck: no adenopathy, supple, symmetrical, trachea midline and thyroid normal to inspection and palpation Lungs: clear to auscultation bilaterally Breasts: normal appearance, no masses or tenderness, No nipple retraction or dimpling, No nipple discharge or bleeding, No axillary adenopathy Heart: regular rate and rhythm Abdomen: soft, non-tender; no masses, no organomegaly Extremities: extremities normal, atraumatic, no cyanosis or edema Skin: skin color, texture, turgor normal. No rashes or lesions Lymph nodes: cervical, supraclavicular, and axillary nodes normal. Neurologic: grossly normal  Pelvic: External genitalia:  no lesions              No abnormal inguinal nodes palpated.              Urethra:  normal appearing urethra with no masses, tenderness or lesions              Bartholins and Skenes: normal                 Vagina: normal appearing vagina with normal color and discharge, no lesions              Cervix: no lesions.  Atrophy noted.               Pap taken: yes Bimanual Exam:  Uterus:  normal size, contour, position, consistency, mobility, non-tender              Adnexa: no mass, fullness, tenderness              Rectal exam: yes.  Confirms.              Anus:  normal sphincter tone, no lesions  Chaperone was present for exam:  Warren Lacy, CMA  Assessment:   Well woman visit with gynecologic exam. Vaginal atrophy.  Decreased libido. Urinary urgency.    Plan: Mammogram screening discussed. Self breast awareness reviewed. Pap and HR HPV  collected. Guidelines for Calcium, Vitamin D, regular exercise program including cardiovascular and weight bearing exercise. Urinalysis - normal.  Non UC sent.  Rx for Vagifem.  I discussed  potential effect on breast cancer.  Will check testosterone levels.  Will do a prescription for compounded testosterone cream after levels are back.  I discussed side effects and that this is a nonFDA approved medication to treat decreased libido in women. FU for a recheck in 8 weeks.  Follow up annually and prn.   After visit summary provided.

## 2022-12-18 ENCOUNTER — Ambulatory Visit (INDEPENDENT_AMBULATORY_CARE_PROVIDER_SITE_OTHER): Payer: Managed Care, Other (non HMO) | Admitting: Obstetrics and Gynecology

## 2022-12-18 ENCOUNTER — Encounter: Payer: Self-pay | Admitting: Obstetrics and Gynecology

## 2022-12-18 ENCOUNTER — Other Ambulatory Visit (HOSPITAL_COMMUNITY)
Admission: RE | Admit: 2022-12-18 | Discharge: 2022-12-18 | Disposition: A | Discharge status: Home / Self Care | Payer: Managed Care, Other (non HMO) | Source: Ambulatory Visit | Attending: Obstetrics and Gynecology | Admitting: Obstetrics and Gynecology

## 2022-12-18 VITALS — BP 136/84 | HR 95 | Ht 63.0 in | Wt 120.0 lb

## 2022-12-18 DIAGNOSIS — Z124 Encounter for screening for malignant neoplasm of cervix: Secondary | ICD-10-CM

## 2022-12-18 DIAGNOSIS — Z01419 Encounter for gynecological examination (general) (routine) without abnormal findings: Secondary | ICD-10-CM

## 2022-12-18 DIAGNOSIS — R3915 Urgency of urination: Secondary | ICD-10-CM

## 2022-12-18 DIAGNOSIS — R6882 Decreased libido: Secondary | ICD-10-CM

## 2022-12-18 LAB — URINALYSIS, COMPLETE W/RFL CULTURE
Bacteria, UA: NONE SEEN /HPF
Bilirubin Urine: NEGATIVE
Glucose, UA: NEGATIVE
Hgb urine dipstick: NEGATIVE
Hyaline Cast: NONE SEEN /LPF
Ketones, ur: NEGATIVE
Leukocyte Esterase: NEGATIVE
Nitrites, Initial: NEGATIVE
Protein, ur: NEGATIVE
RBC / HPF: NONE SEEN /HPF (ref 0–2)
Specific Gravity, Urine: 1.015 (ref 1.001–1.035)
WBC, UA: NONE SEEN /HPF (ref 0–5)
pH: 7 (ref 5.0–8.0)

## 2022-12-18 LAB — NO CULTURE INDICATED

## 2022-12-18 MED ORDER — ESTRADIOL 10 MCG VA TABS: ORAL_TABLET | VAGINAL | 3 refills | Status: DC

## 2022-12-18 NOTE — Patient Instructions (Signed)

## 2022-12-18 NOTE — Addendum Note (Signed)
Addended by: Melrose Nakayama on: 12/18/2022 02:53 PM   Modules accepted: Orders

## 2022-12-19 ENCOUNTER — Encounter: Payer: Self-pay | Admitting: Obstetrics and Gynecology

## 2022-12-20 LAB — CYTOLOGY - PAP
Comment: NEGATIVE
Diagnosis: NEGATIVE
High risk HPV: NEGATIVE

## 2022-12-22 ENCOUNTER — Other Ambulatory Visit: Payer: Self-pay | Admitting: Obstetrics and Gynecology

## 2022-12-22 LAB — TESTOS,TOTAL,FREE AND SHBG (FEMALE)
Free Testosterone: 1.8 pg/mL (ref 0.1–6.4)
Sex Hormone Binding: 117.4 nmol/L — ABNORMAL HIGH (ref 14–73)
Testosterone, Total, LC-MS-MS: 29 ng/dL (ref 2–45)

## 2022-12-22 MED ORDER — ESTRADIOL 10 MCG VA TABS: ORAL_TABLET | VAGINAL | 3 refills | Status: DC

## 2022-12-22 NOTE — Progress Notes (Signed)
Rx for Vagifem to [facility] [place].

## 2023-01-29 NOTE — Progress Notes (Deleted)
GYNECOLOGY  VISIT   HPI: 57 y.o.   Married  Caucasian  female   G1P1001 with Patient's last menstrual period was 05/06/2017.   here for   8 week recheck and labs  GYNECOLOGIC HISTORY: Patient's last menstrual period was 05/06/2017. Contraception:  PMP Menopausal hormone therapy:  estradiol Last mammogram:    06/28/22 Breast density Cat C, BI-RADS CAT 1 neg  Last pap smear:  12/18/22 neg: HR HPV neg        OB History     Gravida  1   Para  1   Term  1   Preterm      AB      Living  1      SAB      IAB      Ectopic      Multiple      Live Births  1              Patient Active Problem List   Diagnosis Date Noted   BCC (basal cell carcinoma), history of, sees dermatologist 06/20/2016   FH: melanoma 06/20/2016   Hot flashes 04/13/2014   Hypothyroidism 04/09/2010   HYPERLIPIDEMIA 04/09/2010   MIGRAINE HEADACHE 04/09/2010   ALLERGIC RHINITIS 04/09/2010   NEPHROLITHIASIS, HX OF 04/09/2010    Past Medical History:  Diagnosis Date   Allergy    BCC (basal cell carcinoma), history of, sees dermatologist 06/20/2016   Endometriosis 1998   Headache    Hyperlipidemia    Hypothyroidism    In vitro fertilization    Nephrolithiasis    Panic attack    PIH (pregnancy induced hypertension)    Pre-eclampsia     Past Surgical History:  Procedure Laterality Date   BREAST CYST EXCISION  1998   BUNIONECTOMY Left 05/25/2015   Procedure: MODIFIED MCBRIDE BUNIONECTOMY;  Surgeon: Toni Arthurs, MD;  Location: [facility];  Service: Orthopedics;  Laterality: Left;   COLPOSCOPY     EXCISION MASS LOWER EXTREMETIES Left 05/25/2015   Procedure: EXCISION MASS LEFT LATERAL FOOT;  Surgeon: Toni Arthurs, MD;  Location: [facility];  Service: Orthopedics;  Laterality: Left;   LAPAROSCOPY  1998   METATARSAL OSTEOTOMY Left 05/25/2015   Procedure: LEFT FIRST MT SCARF OSTEOTOMY;  Surgeon: Toni Arthurs, MD;  Location:  SURGERY CENTER;  Service:  Orthopedics;  Laterality: Left;   upper jaw surgery      Current Outpatient Medications  Medication Sig Dispense Refill   ALPRAZolam (XANAX) 0.5 MG tablet Take by mouth.     b complex vitamins capsule Take 1 capsule by mouth daily.     Calcium Carbonate-Vit D-Min (CALCIUM 1200 PO) Take by mouth.     Estradiol (VAGIFEM) 10 MCG TABS vaginal tablet Place one tablet (10 mcg) per vagina at hs for 2 weeks.  Then place one tablet per vagina at hs twice a week. 34 tablet 3   fexofenadine (ALLEGRA) 180 MG tablet Take 180 mg by mouth daily.     fluticasone (FLONASE) 50 MCG/ACT nasal spray Place into both nostrils daily.     hydrocortisone 2.5 % cream SMARTSIG:In Ear(s) Twice Daily PRN (Patient not taking: Reported on 12/18/2022)     ketoconazole (NIZORAL) 2 % cream Apply 1 Application topically 2 (two) times daily. (Patient not taking: Reported on 12/18/2022)     MAGNESIUM CITRATE PO magnesium     Multiple Vitamin (MULTIVITAMIN) tablet Take 1 tablet by mouth daily.     Omega 3-6-9 Fatty Acids (OMEGA-3-6-9  PO) Take by mouth.     temazepam (RESTORIL) 7.5 MG capsule Take by mouth.     thyroid (ARMOUR THYROID) 15 MG tablet TAKE ONE AND ONE-HALF TABLETS ONCE DAILY (NEED AN APPOINTMENT) 135 tablet 0   Vitamin D, Cholecalciferol, 25 MCG (1000 UT) CAPS Take by mouth.     ZINC OXIDE PO Take by mouth.     No current facility-administered medications for this visit.     ALLERGIES: Minocycline hcl and Sulfamethoxazole-trimethoprim  Family History  Problem Relation Age of Onset   Arthritis Mother    Thyroid cancer Mother    Melanoma Mother        retinal   Aneurysm Mother        unruptured   Fibroids Mother    Hypertension Father    Melanoma Father    Squamous cell carcinoma Father    Cancer Maternal Grandmother        breast   Fibroids Maternal Grandmother    Heart disease Maternal Grandfather    Alcohol abuse Paternal Grandmother     Social History   Socioeconomic History   Marital status:  Married    Spouse name: Not on file   Number of children: 1   Years of education: Not on file   Highest education level: Not on file  Occupational History   Not on file  Tobacco Use   Smoking status: Never   Smokeless tobacco: Never  Vaping Use   Vaping status: Never Used  Substance and Sexual Activity   Alcohol use: Yes    Comment: socially, 2-3 times per week 1-2 drinks   Drug use: Never   Sexual activity: Yes    Partners: Male    Birth control/protection: Post-menopausal    Comment: first intercourse >16, less than 5 partners  Other Topics Concern   Not on file  Social History Narrative   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      Home Situation: senior in highschool in 2018      Spiritual Beliefs:       Lifestyle: regular exercise, eats health   Social Determinants of Health   Financial Resource Strain: Low Risk  (07/28/2022)   Received from [facility], [facility]   Overall Financial Resource Strain (CARDIA)    Difficulty of Paying Living Expenses: Not hard at all  Food Insecurity: No Food Insecurity (07/28/2022)   Received from [facility], [facility]   Hunger Vital Sign    Worried About Running Out of Food in the Last Year: Never true    Ran Out of Food in the Last Year: Never true  Transportation Needs: No Transportation Needs (07/28/2022)   Received from [facility], [facility]   PRAPARE - Transportation    Lack of Transportation (Medical): No    Lack of Transportation (Non-Medical): No  Physical Activity: Sufficiently Active (07/28/2022)   Received from [facility], [facility]   Exercise Vital Sign    Days of Exercise per Week: 7 days    Minutes of Exercise per Session: 40 min  Stress: No Stress Concern Present (07/28/2022)   Received from [facility], [facility] - Occupational Stress Questionnaire    Feeling of Stress : Not  at all  Recent Concern: Stress - Stress Concern Present (06/19/2022)   Received from [facility] of Occupational Health - Occupational Stress Questionnaire  Feeling of Stress : To some extent  Social Connections: Socially Integrated (07/28/2022)   Received from [facility], [facility]   Social Network    How would you rate your social network (family, work, friends)?: Good participation with social networks  Intimate Partner Violence: Not At Risk (07/28/2022)   Received from [facility], [facility]   HITS    Over the last 12 months how often did your partner physically hurt you?: 1    Over the last 12 months how often did your partner insult you or talk down to you?: 1    Over the last 12 months how often did your partner threaten you with physical harm?: 1    Over the last 12 months how often did your partner scream or curse at you?: 1    Review of Systems  PHYSICAL EXAMINATION:    LMP 05/06/2017     General appearance: alert, cooperative and appears stated age Head: Normocephalic, without obvious abnormality, atraumatic Neck: no adenopathy, supple, symmetrical, trachea midline and thyroid normal to inspection and palpation Lungs: clear to auscultation bilaterally Breasts: normal appearance, no masses or tenderness, No nipple retraction or dimpling, No nipple discharge or bleeding, No axillary or supraclavicular adenopathy Heart: regular rate and rhythm Abdomen: soft, non-tender, no masses,  no organomegaly Extremities: extremities normal, atraumatic, no cyanosis or edema Skin: Skin color, texture, turgor normal. No rashes or lesions Lymph nodes: Cervical, supraclavicular, and axillary nodes normal. No abnormal inguinal nodes palpated Neurologic: Grossly normal  Pelvic: External genitalia:  no lesions              Urethra:  normal appearing urethra with no masses, tenderness or lesions              Bartholins and Skenes: normal                  Vagina: normal appearing vagina with normal color and discharge, no lesions              Cervix: no lesions                Bimanual Exam:  Uterus:  normal size, contour, position, consistency, mobility, non-tender              Adnexa: no mass, fullness, tenderness              Rectal exam: {yes no:[id number]}.  Confirms.              Anus:  normal sphincter tone, no lesions  Chaperone was present for exam:  ***  ASSESSMENT     PLAN     An After Visit Summary was printed and given to the patient.  ______ minutes face to face time of which over 50% was spent in counseling.

## 2023-02-09 ENCOUNTER — Encounter: Payer: Self-pay | Admitting: Obstetrics and Gynecology

## 2023-02-12 ENCOUNTER — Ambulatory Visit: Payer: Managed Care, Other (non HMO) | Admitting: Obstetrics and Gynecology

## 2023-07-17 ENCOUNTER — Encounter: Payer: Self-pay | Admitting: Obstetrics and Gynecology

## 2023-08-27 ENCOUNTER — Encounter: Payer: Self-pay | Admitting: Obstetrics and Gynecology

## 2023-08-28 ENCOUNTER — Other Ambulatory Visit: Payer: Self-pay | Admitting: Obstetrics and Gynecology

## 2023-08-28 MED ORDER — NONFORMULARY OR COMPOUNDED ITEM: 1 refills | Status: DC

## 2023-08-28 NOTE — Progress Notes (Signed)
Rx for testosterone cream

## 2023-11-08 ENCOUNTER — Other Ambulatory Visit: Payer: Self-pay | Admitting: Obstetrics and Gynecology

## 2023-11-10 NOTE — Telephone Encounter (Signed)
 Med refill request: Yuvafem  vaginal tablet Last AEX: 07/21/20 BS Next AEX: 12/24/23 BS Last MMG (if hormonal med) 07/14/23 Refill authorized: Last Rx sent (Vagifem ) #34 with 3 refills on 12/22/22 BS. Please approve or deny

## 2023-11-25 ENCOUNTER — Telehealth: Payer: Self-pay

## 2023-11-25 NOTE — Telephone Encounter (Signed)
 Patient left a message on the triage line stating that she needed lab orders put in for a follow up appointment for using testosterone. Per Dr. Cyrilla patient message on 08/27/23 she states: Please make an appointment to see me in 6 weeks for a recheck appointment to see how it is working. We will need to do blood work on that day also.  Patient needs appointment with Dr. Nikki and Labs will be done that day after she see Dr. Nikki. Please schedule office.

## 2023-12-09 ENCOUNTER — Ambulatory Visit (INDEPENDENT_AMBULATORY_CARE_PROVIDER_SITE_OTHER): Admitting: Obstetrics and Gynecology

## 2023-12-09 ENCOUNTER — Encounter: Payer: Self-pay | Admitting: Obstetrics and Gynecology

## 2023-12-09 VITALS — BP 130/80 | HR 78 | Ht 63.0 in | Wt 116.4 lb

## 2023-12-09 DIAGNOSIS — Z5181 Encounter for therapeutic drug level monitoring: Secondary | ICD-10-CM

## 2023-12-09 DIAGNOSIS — Z79890 Hormone replacement therapy (postmenopausal): Secondary | ICD-10-CM

## 2023-12-09 DIAGNOSIS — R6882 Decreased libido: Secondary | ICD-10-CM

## 2023-12-09 DIAGNOSIS — N952 Postmenopausal atrophic vaginitis: Secondary | ICD-10-CM

## 2023-12-09 NOTE — Progress Notes (Unsigned)
 GYNECOLOGY  VISIT   HPI: 58 y.o.   Married  Caucasian female   G1P1001 with Patient's last menstrual period was 05/06/2017.   here for: Follow up on medications with lab work- Testosterone cream and Vagifem .   Previous testosterone levels were normal 12/18/22.   Started testosterone cream 1/2 gram to skin daily.   Has not used a few days.   Not seeing any change in libido.    Not seeing any irritation.    No side effects.    Using Vagifem .   Previous use of Intrarosa.  GYNECOLOGIC HISTORY: Patient's last menstrual period was 05/06/2017. Contraception:  PMP Menopausal hormone therapy:  Testosterone cream and Yuvafem  Last 2 paps:  12/18/22 neg HR HPV neg, 06/27/19 unsatisfactory with scan cells (Dr. Burney), 05/19/18 neg, neg HR HPV (Dr. Manda), 04/15/12 neg  History of abnormal Pap or positive HPV:  no Mammogram:  07/14/23 Breast Density Cat C, BIRADS Cat 1 neg         OB History     Gravida  1   Para  1   Term  1   Preterm      AB      Living  1      SAB      IAB      Ectopic      Multiple      Live Births  1              Patient Active Problem List   Diagnosis Date Noted   BCC (basal cell carcinoma), history of, sees dermatologist 06/20/2016   FH: melanoma 06/20/2016   Hot flashes 04/13/2014   Hypothyroidism 04/09/2010   HYPERLIPIDEMIA 04/09/2010   MIGRAINE HEADACHE 04/09/2010   ALLERGIC RHINITIS 04/09/2010   NEPHROLITHIASIS, HX OF 04/09/2010    Past Medical History:  Diagnosis Date   Allergy    BCC (basal cell carcinoma), history of, sees dermatologist 06/20/2016   Endometriosis 1998   Headache    Hyperlipidemia    Hypothyroidism    In vitro fertilization    Nephrolithiasis    Panic attack    PIH (pregnancy induced hypertension)    Pre-eclampsia     Past Surgical History:  Procedure Laterality Date   BREAST CYST EXCISION  1998   BUNIONECTOMY Left 05/25/2015   Procedure: MODIFIED MCBRIDE BUNIONECTOMY;  Surgeon: Norleen Armor,  MD;  Location: [facility];  Service: Orthopedics;  Laterality: Left;   COLPOSCOPY     EXCISION MASS LOWER EXTREMETIES Left 05/25/2015   Procedure: EXCISION MASS LEFT LATERAL FOOT;  Surgeon: Norleen Armor, MD;  Location: [facility];  Service: Orthopedics;  Laterality: Left;   LAPAROSCOPY  1998   METATARSAL OSTEOTOMY Left 05/25/2015   Procedure: LEFT FIRST MT SCARF OSTEOTOMY;  Surgeon: Norleen Armor, MD;  Location: [facility];  Service: Orthopedics;  Laterality: Left;   upper jaw surgery      Current Outpatient Medications  Medication Sig Dispense Refill   Calcium Carbonate-Vit D-Min (CALCIUM 1200 PO) Take by mouth.     Estradiol  (YUVAFEM ) 10 MCG TABS vaginal tablet Place one tablet (10 mcg) per vagina at bedtime twice weekly. 24 tablet 0   fluticasone (FLONASE) 50 MCG/ACT nasal spray Place into both nostrils daily.     MAGNESIUM CITRATE PO magnesium     MAGNESIUM GLYCINATE PO Magnesium Glycinate     Multiple Vitamin (MULTIVITAMIN) tablet Take 1 tablet by mouth daily.     NONFORMULARY OR COMPOUNDED ITEM  Testosterone cream 1%.  Apply 0.5 gram daily to skin of lower abdomen, thigh or arm daily.  Rotate site.  Disp:  30 grams, RF one. 30 each 1   temazepam (RESTORIL) 7.5 MG capsule Take by mouth.     thyroid  (ARMOUR THYROID ) 15 MG tablet TAKE ONE AND ONE-HALF TABLETS ONCE DAILY (NEED AN APPOINTMENT) 135 tablet 0   Vitamin D, Cholecalciferol, 25 MCG (1000 UT) CAPS Take by mouth.     ZINC OXIDE PO Take by mouth.     ALPRAZolam (XANAX) 0.5 MG tablet Take by mouth. (Patient not taking: Reported on 12/09/2023)     b complex vitamins capsule Take 1 capsule by mouth daily. (Patient not taking: Reported on 12/09/2023)     fexofenadine (ALLEGRA) 180 MG tablet Take 180 mg by mouth daily. (Patient not taking: Reported on 12/09/2023)     Omega 3-6-9 Fatty Acids (OMEGA-3-6-9 PO) Take by mouth. (Patient not taking: Reported on 12/09/2023)     No current facility-administered  medications for this visit.     ALLERGIES: Minocycline hcl and Sulfamethoxazole-trimethoprim  Family History  Problem Relation Age of Onset   Arthritis Mother    Thyroid  cancer Mother    Melanoma Mother        retinal   Aneurysm Mother        unruptured   Fibroids Mother    Hypertension Father    Melanoma Father    Squamous cell carcinoma Father    Cancer Maternal Grandmother        breast   Fibroids Maternal Grandmother    Heart disease Maternal Grandfather    Alcohol abuse Paternal Grandmother     Social History   Socioeconomic History   Marital status: Married    Spouse name: Not on file   Number of children: 1   Years of education: Not on file   Highest education level: Not on file  Occupational History   Not on file  Tobacco Use   Smoking status: Never   Smokeless tobacco: Never  Vaping Use   Vaping status: Never Used  Substance and Sexual Activity   Alcohol use: Yes    Comment: socially, 2-3 times per week 1-2 drinks   Drug use: Never   Sexual activity: Yes    Partners: Male    Birth control/protection: Post-menopausal    Comment: first intercourse >16, less than 5 partners  Other Topics Concern   Not on file  Social History Narrative   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      Home Situation: senior in highschool in 2018      Spiritual Beliefs:       Lifestyle: regular exercise, eats health   Social Drivers of Health   Financial Resource Strain: Low Risk  (08/15/2023)   Received from [facility]   Overall Financial Resource Strain (CARDIA)    Difficulty of Paying Living Expenses: Not hard at all  Food Insecurity: No Food Insecurity (08/15/2023)   Received from [facility]   Hunger Vital Sign    Within the past 12 months, you worried that your food would run out before you got the money to buy more.: Never true    Within the past 12 months, the food you bought just didn't last  and you didn't have money to get more.: Never true  Transportation Needs: No Transportation Needs (08/15/2023)   Received from [facility] - Transportation  Lack of Transportation (Medical): No    Lack of Transportation (Non-Medical): No  Physical Activity: Sufficiently Active (08/15/2023)   Received from [facility]   Exercise Vital Sign    On average, how many days per week do you engage in moderate to strenuous exercise (like a brisk walk)?: 7 days    On average, how many minutes do you engage in exercise at this level?: 40 min  Stress: No Stress Concern Present (08/15/2023)   Received from [facility] of [facility]ational Health - Occupational Stress Questionnaire    Feeling of Stress : Not at all  Social Connections: Socially Integrated (08/15/2023)   Received from [facility]   Social Network    How would you rate your social network (family, work, friends)?: Good participation with social networks  Intimate Partner Violence: Not At Risk (08/15/2023)   Received from [facility]   HITS    Over the last 12 months how often did your partner physically hurt you?: Never    Over the last 12 months how often did your partner insult you or talk down to you?: Never    Over the last 12 months how often did your partner threaten you with physical harm?: Never    Over the last 12 months how often did your partner scream or curse at you?: Never    Review of Systems  See HPI.  PHYSICAL EXAMINATION:   BP 130/80 (BP Location: Left Arm, Patient Position: Sitting, Cuff Size: Normal)   Pulse 78   Ht 5' 3 (1.6 m)   Wt 116 lb 6.4 oz (52.8 kg)   LMP 05/06/2017   SpO2 100%   BMI 20.62 kg/m     General appearance: alert, cooperative and appears stated age   ASSESSMENT:  Decreased libido. Vaginal atrophy.  Encounter for medication monitoring.   PLAN:  We discussed that testosterone levels do not predict libido.  The levels are measured as a starting  point and to help guide dosing of the medication to keep a woman in a female range of this hormone.  Check testosterone levels today. Would like to increase testosterone dose if possible.  We talked about Estring  as an alternative to Vagifem .  She will continue Vagifem  for now.  She uses [facility] for her testosterone prescription.   Follow up for annual exam and prn.   23 min  total time was spent for this patient encounter, including preparation, face-to-face counseling with the patient, coordination of care, and documentation of the encounter.

## 2023-12-14 LAB — TESTOS,TOTAL,FREE AND SHBG (FEMALE)
Free Testosterone: 2.3 pg/mL (ref 0.1–6.4)
Sex Hormone Binding: 85 nmol/L — ABNORMAL HIGH (ref 14–73)
Testosterone, Total, LC-MS-MS: 32 ng/dL (ref 2–45)

## 2023-12-15 ENCOUNTER — Ambulatory Visit: Payer: Self-pay | Admitting: Obstetrics and Gynecology

## 2023-12-15 DIAGNOSIS — R6882 Decreased libido: Secondary | ICD-10-CM

## 2023-12-15 DIAGNOSIS — Z5181 Encounter for therapeutic drug level monitoring: Secondary | ICD-10-CM

## 2023-12-18 MED ORDER — NONFORMULARY OR COMPOUNDED ITEM: 1 refills | Status: DC

## 2023-12-24 ENCOUNTER — Ambulatory Visit: Admitting: Obstetrics and Gynecology

## 2024-01-08 ENCOUNTER — Ambulatory Visit: Admitting: Obstetrics and Gynecology

## 2024-02-04 ENCOUNTER — Encounter: Payer: Self-pay | Admitting: Obstetrics and Gynecology

## 2024-02-06 NOTE — Telephone Encounter (Signed)
 Spoke with patient, OV moved to 10/8 at 1500 with Dr. Nikki. This will allow time for results to return, patient will not need refill prior to appt.   Routing to provider for final review. Patient is agreeable to disposition. Will close encounter.

## 2024-02-11 ENCOUNTER — Encounter: Payer: Self-pay | Admitting: Obstetrics and Gynecology

## 2024-02-11 ENCOUNTER — Ambulatory Visit (INDEPENDENT_AMBULATORY_CARE_PROVIDER_SITE_OTHER): Admitting: Obstetrics and Gynecology

## 2024-02-11 VITALS — BP 122/78 | HR 98

## 2024-02-11 DIAGNOSIS — N951 Menopausal and female climacteric states: Secondary | ICD-10-CM | POA: Diagnosis not present

## 2024-02-11 DIAGNOSIS — Z5181 Encounter for therapeutic drug level monitoring: Secondary | ICD-10-CM

## 2024-02-11 DIAGNOSIS — R6882 Decreased libido: Secondary | ICD-10-CM | POA: Diagnosis not present

## 2024-02-11 MED ORDER — PROGESTERONE MICRONIZED 100 MG PO CAPS: 100.0000 mg | ORAL_CAPSULE | Freq: Every day | ORAL | 3 refills | Status: DC

## 2024-02-11 MED ORDER — ESTRADIOL 0.1 MG/GM VA CREA: TOPICAL_CREAM | VAGINAL | 0 refills | Status: AC

## 2024-02-11 MED ORDER — ESTRADIOL 0.05 MG/24HR TD PTTW: 1.0000 | MEDICATED_PATCH | TRANSDERMAL | 3 refills | Status: DC

## 2024-02-11 MED ORDER — ESTRADIOL 10 MCG VA TABS: ORAL_TABLET | VAGINAL | 0 refills | Status: DC

## 2024-02-11 NOTE — Progress Notes (Signed)
 GYNECOLOGY  VISIT   HPI: 58 y.o.   Married  Caucasian female   G1P1001 with Patient's last menstrual period was 05/06/2017.   here for: Follow up on meds- Yuvafem  & testosterone. Wants to try HRT after watching a pod cast.   States her sleeping is not good.  Takes temazepam as needed.   Some increase in her hot flashes.   Dry eyes.  Using testosterone cream in the day.   She is not sure if it is helping her libido.  GYNECOLOGIC HISTORY: Patient's last menstrual period was 05/06/2017. Contraception:  PMP Menopausal hormone therapy:  Yuvafem  & testosterone     Last 2 paps:  12/18/22 neg HR HPV neg, 04/15/12 neg  History of abnormal Pap or positive HPV:  no Mammogram:  07/14/23 Breast Density Cat C, BIRADS Cat 1 neg         OB History     Gravida  1   Para  1   Term  1   Preterm      AB      Living  1      SAB      IAB      Ectopic      Multiple      Live Births  1              Patient Active Problem List   Diagnosis Date Noted   BCC (basal cell carcinoma), history of, sees dermatologist 06/20/2016   FH: melanoma 06/20/2016   Hot flashes 04/13/2014   Hypothyroidism 04/09/2010   HYPERLIPIDEMIA 04/09/2010   MIGRAINE HEADACHE 04/09/2010   ALLERGIC RHINITIS 04/09/2010   NEPHROLITHIASIS, HX OF 04/09/2010    Past Medical History:  Diagnosis Date   Allergy    BCC (basal cell carcinoma), history of, sees dermatologist 06/20/2016   Endometriosis 1998   Headache    Hyperlipidemia    Hypothyroidism    In vitro fertilization    Nephrolithiasis    Panic attack    PIH (pregnancy induced hypertension)    Pre-eclampsia     Past Surgical History:  Procedure Laterality Date   BREAST CYST EXCISION  1998   BUNIONECTOMY Left 05/25/2015   Procedure: MODIFIED MCBRIDE BUNIONECTOMY;  Surgeon: Norleen Armor, MD;  Location: [facility];  Service: Orthopedics;  Laterality: Left;   COLPOSCOPY     EXCISION MASS LOWER EXTREMETIES Left 05/25/2015    Procedure: EXCISION MASS LEFT LATERAL FOOT;  Surgeon: Norleen Armor, MD;  Location: [facility];  Service: Orthopedics;  Laterality: Left;   LAPAROSCOPY  1998   METATARSAL OSTEOTOMY Left 05/25/2015   Procedure: LEFT FIRST MT SCARF OSTEOTOMY;  Surgeon: Norleen Armor, MD;  Location: [facility];  Service: Orthopedics;  Laterality: Left;   upper jaw surgery      Current Outpatient Medications  Medication Sig Dispense Refill   Calcium Carbonate-Vit D-Min (CALCIUM 1200 PO) Take by mouth.     Estradiol  (YUVAFEM ) 10 MCG TABS vaginal tablet Place one tablet (10 mcg) per vagina at bedtime twice weekly. 24 tablet 0   fexofenadine (ALLEGRA) 180 MG tablet Take 180 mg by mouth daily.     fluticasone (FLONASE) 50 MCG/ACT nasal spray Place into both nostrils daily.     MAGNESIUM CITRATE PO magnesium     MAGNESIUM GLYCINATE PO Magnesium Glycinate     Multiple Vitamin (MULTIVITAMIN) tablet Take 1 tablet by mouth daily.     NONFORMULARY OR COMPOUNDED ITEM Testosterone cream 1.5%, apply 0.5  gram daily to skin of abdomen, arm, or leg.  Rotate site.  Dispense:  30 grams, RF one.  [employer]. 30 each 1   Omega 3-6-9 Fatty Acids (OMEGA-3-6-9 PO) Take by mouth.     temazepam (RESTORIL) 7.5 MG capsule Take by mouth.     thyroid  (ARMOUR THYROID ) 15 MG tablet TAKE ONE AND ONE-HALF TABLETS ONCE DAILY (NEED AN APPOINTMENT) 135 tablet 0   Vitamin D, Cholecalciferol, 25 MCG (1000 UT) CAPS Take by mouth.     ZINC OXIDE PO Take by mouth.     No current facility-administered medications for this visit.     ALLERGIES: Minocycline hcl and Sulfamethoxazole-trimethoprim  Family History  Problem Relation Age of Onset   Arthritis Mother    Thyroid  cancer Mother    Melanoma Mother        retinal   Aneurysm Mother        unruptured   Fibroids Mother    Hypertension Father    Melanoma Father    Squamous cell carcinoma Father    Cancer Maternal Grandmother        breast   Fibroids  Maternal Grandmother    Heart disease Maternal Grandfather    Alcohol abuse Paternal Grandmother     Social History   Socioeconomic History   Marital status: Married    Spouse name: Not on file   Number of children: 1   Years of education: Not on file   Highest education level: Not on file  Occupational History   Not on file  Tobacco Use   Smoking status: Never   Smokeless tobacco: Never  Vaping Use   Vaping status: Never Used  Substance and Sexual Activity   Alcohol use: Yes    Comment: socially, 2-3 times per week 1-2 drinks   Drug use: Never   Sexual activity: Yes    Partners: Male    Birth control/protection: Post-menopausal    Comment: first intercourse >16, less than 5 partners  Other Topics Concern   Not on file  Social History Narrative   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      Home Situation: senior in highschool in 2018      Spiritual Beliefs:       Lifestyle: regular exercise, eats health   Social Drivers of Health   Financial Resource Strain: Low Risk  (08/15/2023)   Received from [facility]   Overall Financial Resource Strain (CARDIA)    Difficulty of Paying Living Expenses: Not hard at all  Food Insecurity: No Food Insecurity (08/15/2023)   Received from [facility]   Hunger Vital Sign    Within the past 12 months, you worried that your food would run out before you got the money to buy more.: Never true    Within the past 12 months, the food you bought just didn't last and you didn't have money to get more.: Never true  Transportation Needs: No Transportation Needs (08/15/2023)   Received from [facility] - [facility]    Lack of Transportation (Medical): No    Lack of Transportation (Non-Medical): No  Physical Activity: Sufficiently Active (08/15/2023)   Received from [facility]   Exercise Vital Sign    On average, how many days per week do you engage in  moderate to strenuous exercise (like a brisk walk)?: 7 days    On average, how many minutes do you engage in exercise  at this level?: 40 min  Stress: No Stress Concern Present (08/15/2023)   Received from [facility] - Occupational Stress Questionnaire    Feeling of Stress : Not at all  Social Connections: Socially Integrated (08/15/2023)   Received from [facility]   Social Network    How would you rate your social network (family, work, friends)?: Good participation with social networks  Intimate Partner Violence: Not At Risk (08/15/2023)   Received from [facility]   HITS    Over the last 12 months how often did your partner physically hurt you?: Never    Over the last 12 months how often did your partner insult you or talk down to you?: Never    Over the last 12 months how often did your partner threaten you with physical harm?: Never    Over the last 12 months how often did your partner scream or curse at you?: Never    Review of Systems  All other systems reviewed and are negative.   PHYSICAL EXAMINATION:   BP 122/78 (BP Location: Right Arm, Patient Position: Sitting)   Pulse 98   LMP 05/06/2017   SpO2 98%     General appearance: alert, cooperative and appears stated age   ASSESSMENT:  Menopausal symptoms.  Decreased libido.  Encounter for medication monitoring.   PLAN:  We discussed HRT, routes of administration, side effects, risks and benefits. Risks over time may include stroke, DVT, PE, and breast cancer.   Benefits are improved quality of life, reduced risk of osteoporosis, treatment of vasomotor symptoms.  She will start Vivelle  Dot 0.05 mg twice weekly.  #8, RF 3.  Prometrium 100 mg at hs, #30, RF 3.  She will continue Vagifem  twice weekly.  #24, RF 0. Start vaginal estradiol  cream pea size amount to the urethra twice weekly.    Will prescribe testosterone after levels are back.  FU for recheck and annual exam  in 3 months.   35 min  total time was spent for this patient encounter, including preparation, face-to-face counseling with the patient, coordination of care, and documentation of the encounter.

## 2024-02-11 NOTE — Patient Instructions (Signed)
 Menopause and HRT (Hormone Replacement Therapy): What to Expect Menopause is the time in your life when your menstrual period stops, and your ovaries stop making certain hormones. These hormones are called estrogen and progesterone. Low levels of these hormones can affect your health and cause symptoms. Hormone replacement therapy (HRT) is a treatment that replaces the estrogen and progesterone in your body. Types of HRT  There are many types and doses of HRT. Talk with your health care provider about what form of HRT is best for you. HRT may include: Estrogen and progestin. This is more common if you have a uterus. Estrogen-only therapy. This is used if you don't have a uterus. You may be given the HRT as: Pills. Patches. Gels. Sprays. A vaginal cream. Vaginal rings. Vaginal inserts. How many hormones you need to take and how long you should take them depend on your health. You should: Start HRT at the lowest possible dose. Stop HRT as soon as you're told to. Work with your provider so you feel informed and comfortable with what happens. Tell a health care provider about: Any allergies you have. Whether you've had blood clots or are at risk for blood clots. Whether you or family members have had cancer, such as cancer of: The breasts. The ovaries. The uterus. Any surgeries you've had. Any medical problems you have. All medicines you take. These include vitamins, herbs, eye drops, and creams. Whether you're pregnant or may be pregnant. What are the benefits? HRT can help with the symptoms of menopause. The benefits depend on: What symptoms you have. How bad your symptoms are. Your overall health. Symptoms of menopause that HRT can help with include: Hot flashes and night sweats. Hot flashes are sudden feelings of heat that spread over your face and body. Your skin may turn red, like a blush. Night sweats are hot flashes that happen when you're sleeping or trying to  sleep. Bone loss. The body loses calcium faster after menopause. This can cause your bones to be weaker. They may be more likely to break. Vaginal dryness. The lining of the vagina can get thin and dry, which can cause: Pain during sex. Infection. Burning. Itching. Urinary tract infections. Not being able to control when you pee. Irritability, which means getting annoyed easily. Short-term memory problems. What are the risks? Risks depend on your health and medical history. They also depend on if you get estrogen and progestin or estrogen-only therapy. HRT may make you more likely to have: Spotting. This is when a small amount of blood leaks from your vagina when you don't expect it to. Endometrial cancer. This is cancer of the lining of the uterus. Breast cancer. Higher density of breast tissue. This can make it harder to find breast cancer on an X-ray. A stroke. Heart disease. Blood clots. Gallbladder or liver disease. You may be more at risk for problems if you have: Endometrial cancer. Liver disease. Heart disease. Breast cancer. A history of blood clots. A history of stroke. Follow these instructions at home: Take your medicines only as told. Do not smoke, vape, or use nicotine or tobacco. Go to your provider as often as told to get: Mammograms. Pelvic exams. Medical checkups. Contact a health care provider if: Your legs hurt or swell. You feel lumps or changes in your breasts or armpits. You feel pain, burning, or bleeding when you pee. You have bleeding from your vagina that's not normal. You feel dizzy or get headaches. You have pain in your belly.  Get help right away if: You have shortness of breath. You have chest pain. You have slurred speech. Any part of your arms or legs feels weak or numb. These symptoms may be an emergency. Call 911 right away. Do not wait to see if the symptoms will go away. Do not drive yourself to the hospital. This information is  not intended to replace advice given to you by your health care provider. Make sure you discuss any questions you have with your health care provider. Document Revised: 12/20/2022 Document Reviewed: 12/20/2022 Elsevier Patient Education  2024 [employer].

## 2024-02-17 LAB — TESTOS,TOTAL,FREE AND SHBG (FEMALE)
Free Testosterone: 4.9 pg/mL (ref 0.1–6.4)
Sex Hormone Binding: 93 nmol/L — ABNORMAL HIGH (ref 14–73)
Testosterone, Total, LC-MS-MS: 54 ng/dL — ABNORMAL HIGH (ref 2–45)

## 2024-02-19 ENCOUNTER — Ambulatory Visit: Payer: Self-pay | Admitting: Obstetrics and Gynecology

## 2024-02-20 MED ORDER — NONFORMULARY OR COMPOUNDED ITEM: 1 refills | Status: DC

## 2024-02-20 NOTE — Telephone Encounter (Signed)
 Pended Rx phoned into [facility] as written per Dr. Nikki. Spoke with [place], Rx read back and confirmed.   Spoke with patient. Advised of Rx per Dr. Nikki. Patient will complete current Rx reducing to 5 days per week. Advised new Rx has been phoned into [facility]. Advised Dr. Nikki will review when she returns, our office will f/u if any additional recommendations.

## 2024-02-20 NOTE — Telephone Encounter (Signed)
 Dr. Karma Greaser -please review and advise

## 2024-03-24 ENCOUNTER — Ambulatory Visit: Admitting: Obstetrics and Gynecology

## 2024-04-04 ENCOUNTER — Encounter: Payer: Self-pay | Admitting: Obstetrics and Gynecology

## 2024-04-05 NOTE — Telephone Encounter (Signed)
 AEX scheduled and 3 mo f/u on 05/10/24 with Dr. Nikki  Routing to Dr. Nikki to review.

## 2024-04-08 ENCOUNTER — Other Ambulatory Visit: Payer: Self-pay | Admitting: Obstetrics and Gynecology

## 2024-04-08 MED ORDER — PROGESTERONE 200 MG PO CAPS: ORAL_CAPSULE | ORAL | 0 refills | Status: DC

## 2024-04-08 NOTE — Progress Notes (Signed)
 Rx for Prometrium  200 mg q hs.

## 2024-05-05 ENCOUNTER — Other Ambulatory Visit (HOSPITAL_COMMUNITY): Payer: Self-pay

## 2024-05-07 ENCOUNTER — Other Ambulatory Visit (HOSPITAL_COMMUNITY): Payer: Self-pay

## 2024-05-08 ENCOUNTER — Other Ambulatory Visit (HOSPITAL_COMMUNITY): Payer: Self-pay

## 2024-05-08 MED ORDER — ESTRADIOL 0.05 MG/24HR TD PTTW
1.0000 | MEDICATED_PATCH | TRANSDERMAL | 3 refills | Status: DC
  Filled 2024-05-08: qty 8, 28d supply, fill #0

## 2024-05-10 ENCOUNTER — Other Ambulatory Visit: Payer: Self-pay | Admitting: Obstetrics and Gynecology

## 2024-05-10 ENCOUNTER — Ambulatory Visit: Admitting: Obstetrics and Gynecology

## 2024-05-10 ENCOUNTER — Other Ambulatory Visit (HOSPITAL_COMMUNITY): Payer: Self-pay

## 2024-05-10 MED ORDER — PROGESTERONE 200 MG PO CAPS: ORAL_CAPSULE | ORAL | 0 refills | Status: DC

## 2024-05-10 MED ORDER — ESTRADIOL 0.05 MG/24HR TD PTTW: 1.0000 | MEDICATED_PATCH | TRANSDERMAL | 0 refills | Status: DC

## 2024-05-10 NOTE — Progress Notes (Signed)
 Rx for Prometrium  and Vivelle  Dot 90 day Rx each with no refills to mail order pharmacy.

## 2024-05-10 NOTE — Telephone Encounter (Signed)
 Spoke with patient. Patient states she did not realize she would be out of town and needed to r/s her 3 mo f/u with AEX. Patient was r/s with Jami in 06/2024 due to patients availability.   Patient provided dates she would be in town, AEX scheduled for 05/25/24 at 1130 with Dr. Nikki.   Patient will be leaving out of town after appt. Patient is asking if 90 day supply can be sent to [facility] of estradiol  and progesterone  or 30 day supply to [facility]. So she can have Rx prior to leaving.   Advised I would have to f/u with Dr. Nikki and return call.   Patient states she has been sleeping through the night since Prometrium  was increased, does find herself wide awake and sometimes anxious at 0530. States she is uncertain of timeline, but thinks no spotting present since Prometrium  was increased.   Routing to Dr. Nikki for review.

## 2024-05-13 NOTE — Telephone Encounter (Signed)
 I called Zendaya and notified her of Dr Cyrilla recommendations. She was appreciative of call.

## 2024-05-24 NOTE — Progress Notes (Unsigned)
 "  59 y.o. G42P1001 Married Caucasian female here for annual exam.   Followed for HRT, vaginal atrophy tx, and testosterone tx.  Needs a recheck of testosterone level, which was elevated.   She is now using testosterone 5 days a week instead of 7.   Increased the progesterone  due to spotting and cramping.  Progesterone  200 mg is not as good as the 100 mg in terms of how she feels, but she is no longer bleeding.   Has a lot of breast tenderness.    Did not really have hot flashes prior to starting HRT.   Using Vagifem  vaginally.  Uses estrace  cream to the urethra, and does not need refills.  PCP: Pura Lenis, MD   Patient's last menstrual period was 05/06/2017.           Sexually active: Yes.    The current method of family planning is post menopausal status.    Menopausal hormone therapy:  Estrace , vivelle , yuvafem , testosterone cream, progesterone   Exercising: Yes.    Walking, weight lifting, sometimes pickleball  Smoker:  no  OB History  Gravida Para Term Preterm AB Living  1 1 1   1   SAB IAB Ectopic Multiple Live Births      1    # Outcome Date GA Lbr Len/2nd Weight Sex Type Anes PTL Lv  1 Term     M Vag-Spont   LIV     HEALTH MAINTENANCE: Last 2 paps:  12/18/22 neg, HR HPV neg, 04/15/12 neg  History of abnormal Pap or positive HPV:  yes, Yes, per patient years ago and colpo done. Normal since.   Mammogram:   07/14/23 Breast Density Cat C, BIRADS Cat 1 neg  Colonoscopy:  03/13/22 - due in 2028. Bone Density:  n/a  Result  n/a   Immunization History  Administered Date(s) Administered   Moderna Sars-Covid-2 Vaccination 07/13/2019, 08/10/2019, 03/09/2020   Td 04/09/2010      reports that she has never smoked. She has never used smokeless tobacco. She reports current alcohol use. She reports that she does not use drugs.  Past Medical History:  Diagnosis Date   Allergy    BCC (basal cell carcinoma), history of, sees dermatologist 06/20/2016   Endometriosis 1998    Headache    Hyperlipidemia    Hypothyroidism    In vitro fertilization    Nephrolithiasis    Panic attack    PIH (pregnancy induced hypertension)    Pre-eclampsia     Past Surgical History:  Procedure Laterality Date   BREAST CYST EXCISION  1998   BUNIONECTOMY Left 05/25/2015   Procedure: MODIFIED MCBRIDE BUNIONECTOMY;  Surgeon: Norleen Armor, MD;  Location: [facility];  Service: Orthopedics;  Laterality: Left;   COLPOSCOPY     EXCISION MASS LOWER EXTREMETIES Left 05/25/2015   Procedure: EXCISION MASS LEFT LATERAL FOOT;  Surgeon: Norleen Armor, MD;  Location: [facility];  Service: Orthopedics;  Laterality: Left;   LAPAROSCOPY  1998   METATARSAL OSTEOTOMY Left 05/25/2015   Procedure: LEFT FIRST MT SCARF OSTEOTOMY;  Surgeon: Norleen Armor, MD;  Location: [facility];  Service: Orthopedics;  Laterality: Left;   upper jaw surgery      Current Outpatient Medications  Medication Sig Dispense Refill   Calcium Carbonate-Vit D-Min (CALCIUM 1200 PO) Take by mouth.     estradiol  (ESTRACE ) 0.1 MG/GM vaginal cream Use a pea size amount to the urethra twice weekly. 42.5 g 0   estradiol  (VIVELLE -DOT)  0.05 MG/24HR patch Place 1 patch (0.05 mg total) onto the skin 2 (two) times a week. 24 patch 0   Estradiol  (YUVAFEM ) 10 MCG TABS vaginal tablet Place one tablet (10 mcg) per vagina at bedtime twice weekly. 24 tablet 0   fexofenadine (ALLEGRA) 180 MG tablet Take 180 mg by mouth daily.     fluticasone (FLONASE) 50 MCG/ACT nasal spray Place into both nostrils daily.     MAGNESIUM GLYCINATE PO Magnesium Glycinate     NONFORMULARY OR COMPOUNDED ITEM Testosterone cream 1.5%, apply 0.5 gram to skin of abdomen, arm, or leg.  Rotate site.  Apply the cream 5 days per week.  Dispense:  30 grams, RF one.  [employer]. 30 each 1   Omega 3-6-9 Fatty Acids (OMEGA-3-6-9 PO) Take by mouth.     progesterone  (PROMETRIUM ) 200 MG capsule Take one capsule (200 mg) by mouth daily  at bedtime. 90 capsule 0   temazepam (RESTORIL) 7.5 MG capsule Take by mouth.     thyroid  (ARMOUR THYROID ) 15 MG tablet TAKE ONE AND ONE-HALF TABLETS ONCE DAILY (NEED AN APPOINTMENT) 135 tablet 0   Vitamin D, Cholecalciferol, 25 MCG (1000 UT) CAPS Take by mouth.     ZINC OXIDE PO Take by mouth.     No current facility-administered medications for this visit.    ALLERGIES: Minocycline hcl and Sulfamethoxazole-trimethoprim  Family History  Problem Relation Age of Onset   Arthritis Mother    Thyroid  cancer Mother    Melanoma Mother        retinal   Aneurysm Mother        unruptured   Fibroids Mother    Hypertension Father    Melanoma Father    Squamous cell carcinoma Father    Cancer Maternal Grandmother        breast   Fibroids Maternal Grandmother    Heart disease Maternal Grandfather    Alcohol abuse Paternal Grandmother     Review of Systems  All other systems reviewed and are negative.   PHYSICAL EXAM:  BP 128/84 (BP Location: Left Arm, Patient Position: Sitting)   Pulse (!) 110   Ht 5' 2 (1.575 m)   Wt 116 lb (52.6 kg)   LMP 05/06/2017   SpO2 95%   BMI 21.22 kg/m     General appearance: alert, cooperative and appears stated age Head: normocephalic, without obvious abnormality, atraumatic Neck: no adenopathy, supple, symmetrical, trachea midline and thyroid  normal to inspection and palpation Lungs: clear to auscultation bilaterally Breasts: normal appearance, no masses or tenderness, No nipple retraction or dimpling, No nipple discharge or bleeding, No axillary adenopathy Heart: regular rate and rhythm Abdomen: soft, non-tender; no masses, no organomegaly Extremities: extremities normal, atraumatic, no cyanosis or edema Skin: skin color, texture, turgor normal. No rashes or lesions Lymph nodes: cervical, supraclavicular, and axillary nodes normal. Neurologic: grossly normal  Pelvic: External genitalia:  no lesions              No abnormal inguinal nodes  palpated.              Urethra:  normal appearing urethra with no masses, tenderness or lesions              Bartholins and Skenes: normal                 Vagina: normal appearing vagina with normal color and discharge, no lesions              Cervix:  no lesions              Pap taken: no Bimanual Exam:  Uterus:  normal size, contour, position, consistency, mobility, non-tender              Adnexa: no mass, fullness, tenderness              Rectal exam: yes.  Confirms.              Anus:  normal sphincter tone, no lesions  Chaperone was present for exam:  Kari HERO, CMA  ASSESSMENT: Well woman visit with gynecologic exam. HRT.  Testosterone tx.  Vaginal atrophy.  Encounter for medication monitoring.  Breast tenderness.   PHQ-2-9: 0  PLAN: Mammogram screening discussed. Self breast awareness reviewed. Pap and HRV collected:  no.  Due in 2029.  Guidelines for Calcium, Vitamin D, regular exercise program including cardiovascular and weight bearing exercise. Will check testosterone levels.  Medication refills:   Lower Vivelle  dot to 0.025 mg twice weekly.  #24, RF 3 Lower Prometrium  to 100 mg q hs.  #90, RF 3 Vagifem  10 mcg pv at hs twice weekly.  #24, RF 3.  Continue vaginal estradiol  cream to the urethra twice weekly. Needs refill of Testosterone after labs back.   Routine labs with PCP.  Follow up:  yearly and prn.             "

## 2024-05-25 ENCOUNTER — Ambulatory Visit (INDEPENDENT_AMBULATORY_CARE_PROVIDER_SITE_OTHER): Admitting: Obstetrics and Gynecology

## 2024-05-25 ENCOUNTER — Encounter: Payer: Self-pay | Admitting: Obstetrics and Gynecology

## 2024-05-25 VITALS — BP 128/84 | HR 110 | Ht 62.0 in | Wt 116.0 lb

## 2024-05-25 DIAGNOSIS — Z1331 Encounter for screening for depression: Secondary | ICD-10-CM

## 2024-05-25 DIAGNOSIS — Z79890 Hormone replacement therapy (postmenopausal): Secondary | ICD-10-CM

## 2024-05-25 DIAGNOSIS — Z01419 Encounter for gynecological examination (general) (routine) without abnormal findings: Secondary | ICD-10-CM | POA: Diagnosis not present

## 2024-05-25 DIAGNOSIS — Z5181 Encounter for therapeutic drug level monitoring: Secondary | ICD-10-CM | POA: Diagnosis not present

## 2024-05-25 MED ORDER — ESTRADIOL 10 MCG VA TABS: ORAL_TABLET | VAGINAL | 3 refills | Status: AC

## 2024-05-25 MED ORDER — PROGESTERONE MICRONIZED 100 MG PO CAPS: 100.0000 mg | ORAL_CAPSULE | Freq: Every day | ORAL | 3 refills | Status: AC

## 2024-05-25 MED ORDER — ESTRADIOL 0.025 MG/24HR TD PTTW: 1.0000 | MEDICATED_PATCH | TRANSDERMAL | 3 refills | Status: AC

## 2024-05-25 NOTE — Patient Instructions (Signed)

## 2024-05-30 ENCOUNTER — Ambulatory Visit: Payer: Self-pay | Admitting: Obstetrics and Gynecology

## 2024-05-30 ENCOUNTER — Other Ambulatory Visit: Payer: Self-pay | Admitting: Obstetrics and Gynecology

## 2024-05-30 DIAGNOSIS — Z5181 Encounter for therapeutic drug level monitoring: Secondary | ICD-10-CM

## 2024-05-30 LAB — TESTOS,TOTAL,FREE AND SHBG (FEMALE)
Free Testosterone: 7 pg/mL — ABNORMAL HIGH (ref 0.1–6.4)
Sex Hormone Binding: 99 nmol/L — ABNORMAL HIGH (ref 14–73)
Testosterone, Total, LC-MS-MS: 116 ng/dL — ABNORMAL HIGH (ref 2–45)

## 2024-05-30 NOTE — Progress Notes (Signed)
 Discontinuation of testosterone while levels are rechecked.

## 2024-06-18 ENCOUNTER — Ambulatory Visit: Admitting: Radiology

## 2024-06-30 ENCOUNTER — Other Ambulatory Visit
